# Patient Record
Sex: Female | Born: 2000 | Race: Black or African American | Hispanic: No | Marital: Single | Smoking: Never smoker
Health system: Southern US, Community
[De-identification: ages and names within clinical notes are randomized; demographics above are authoritative.]

## PROBLEM LIST (undated history)

## (undated) DIAGNOSIS — Z85830 Personal history of malignant neoplasm of bone: Secondary | ICD-10-CM

## (undated) DIAGNOSIS — I10 Essential (primary) hypertension: Secondary | ICD-10-CM

## (undated) HISTORY — PX: REPLACEMENT TOTAL KNEE: SUR1224

## (undated) HISTORY — DX: Personal history of malignant neoplasm of bone: Z85.830

---

## 2020-10-03 ENCOUNTER — Emergency Department (HOSPITAL_COMMUNITY)
Admission: EM | Admit: 2020-10-03 | Discharge: 2020-10-03 | Disposition: A | Discharge status: Home / Self Care | Payer: BC Managed Care – PPO | Attending: Student | Admitting: Student

## 2020-10-03 ENCOUNTER — Encounter (HOSPITAL_COMMUNITY): Payer: Self-pay

## 2020-10-03 DIAGNOSIS — Z3A01 Less than 8 weeks gestation of pregnancy: Secondary | ICD-10-CM | POA: Insufficient documentation

## 2020-10-03 DIAGNOSIS — O26851 Spotting complicating pregnancy, first trimester: Secondary | ICD-10-CM | POA: Insufficient documentation

## 2020-10-03 DIAGNOSIS — Z5321 Procedure and treatment not carried out due to patient leaving prior to being seen by health care provider: Secondary | ICD-10-CM | POA: Diagnosis not present

## 2020-10-03 LAB — CBC WITH DIFFERENTIAL/PLATELET
Abs Immature Granulocytes: 0.01 10*3/uL (ref 0.00–0.07)
Basophils Absolute: 0 10*3/uL (ref 0.0–0.1)
Basophils Relative: 0 %
Eosinophils Absolute: 0.2 10*3/uL (ref 0.0–0.5)
Eosinophils Relative: 3 %
HCT: 38.3 % (ref 36.0–46.0)
Hemoglobin: 11.9 g/dL — ABNORMAL LOW (ref 12.0–15.0)
Immature Granulocytes: 0 %
Lymphocytes Relative: 43 %
Lymphs Abs: 2.9 10*3/uL (ref 0.7–4.0)
MCH: 25.9 pg — ABNORMAL LOW (ref 26.0–34.0)
MCHC: 31.1 g/dL (ref 30.0–36.0)
MCV: 83.3 fL (ref 80.0–100.0)
Monocytes Absolute: 0.4 10*3/uL (ref 0.1–1.0)
Monocytes Relative: 6 %
Neutro Abs: 3.3 10*3/uL (ref 1.7–7.7)
Neutrophils Relative %: 48 %
Platelets: 286 10*3/uL (ref 150–400)
RBC: 4.6 MIL/uL (ref 3.87–5.11)
RDW: 14.3 % (ref 11.5–15.5)
WBC: 6.8 10*3/uL (ref 4.0–10.5)
nRBC: 0 % (ref 0.0–0.2)

## 2020-10-03 LAB — URINALYSIS, ROUTINE W REFLEX MICROSCOPIC
Bilirubin Urine: NEGATIVE
Glucose, UA: NEGATIVE mg/dL
Ketones, ur: NEGATIVE mg/dL
Leukocytes,Ua: NEGATIVE
Nitrite: NEGATIVE
Protein, ur: NEGATIVE mg/dL
Specific Gravity, Urine: 1.029 (ref 1.005–1.030)
pH: 5 (ref 5.0–8.0)

## 2020-10-03 LAB — LIPASE, BLOOD: Lipase: 32 U/L (ref 11–51)

## 2020-10-03 LAB — COMPREHENSIVE METABOLIC PANEL
ALT: 12 U/L (ref 0–44)
AST: 18 U/L (ref 15–41)
Albumin: 3.9 g/dL (ref 3.5–5.0)
Alkaline Phosphatase: 81 U/L (ref 38–126)
Anion gap: 9 (ref 5–15)
BUN: 11 mg/dL (ref 6–20)
CO2: 23 mmol/L (ref 22–32)
Calcium: 9.7 mg/dL (ref 8.9–10.3)
Chloride: 106 mmol/L (ref 98–111)
Creatinine, Ser: 0.69 mg/dL (ref 0.44–1.00)
GFR, Estimated: >60 mL/min (ref >60)
Glucose, Bld: 90 mg/dL (ref 70–99)
Potassium: 3.6 mmol/L (ref 3.5–5.1)
Sodium: 138 mmol/L (ref 135–145)
Total Bilirubin: 0.5 mg/dL (ref 0.3–1.2)
Total Protein: 7.7 g/dL (ref 6.5–8.1)

## 2020-10-03 LAB — I-STAT BETA HCG BLOOD, ED (MC, WL, AP ONLY): I-stat hCG, quantitative: 9.3 m[IU]/mL — ABNORMAL HIGH (ref <5)

## 2020-10-03 NOTE — ED Triage Notes (Signed)
Pt took a pregnancy test 1.5 weeks ago that was faintly positive. Pt having light spotting for the past few weeks with cramping.

## 2020-10-03 NOTE — ED Provider Notes (Signed)
Emergency Medicine Provider Triage Evaluation Note  Carisma Troupe , a 20 y.o. female  was evaluated in triage.  Pt complains of abdominal cramping and vaginal bleeding.  Last menstrual cycle in August, took a urine pregnancy test 2 weeks ago that was faintly positive.  She is been having intermittent cramping since then as well as intermittent vaginal bleeding..  Review of Systems  Positive: Vaginal bleeding, abdominal cramping Negative: Fevers, vomiting  Physical Exam  BP (!) 159/108 (BP Location: Right Arm)   Pulse (!) 109   Temp 99.3 F (37.4 C)   Resp 16   LMP 08/31/2020 (Exact Date)   SpO2 100%  Gen:   Awake, no distress   Resp:  Normal effort  MSK:   Moves extremities without difficulty  Other:  Abdomen is soft and nontender  Medical Decision Making  Medically screening exam initiated at 5:07 PM.  Appropriate orders placed.  Avamae Dehaan was informed that the remainder of the evaluation will be completed by another provider, this initial triage assessment does not replace that evaluation, and the importance of remaining in the ED until their evaluation is complete.  Will evaluate to see if patient is pregnant.  If pregnant will send to [facility], if negative will work-up for abdominal pain and vaginal bleeding here in the ED.   Theron Arista, PA-C 10/03/20 1708    Gloris Manchester, MD 10/05/20 1538

## 2021-01-05 ENCOUNTER — Ambulatory Visit (INDEPENDENT_AMBULATORY_CARE_PROVIDER_SITE_OTHER): Payer: Medicaid Other

## 2021-01-05 ENCOUNTER — Other Ambulatory Visit: Payer: Self-pay

## 2021-01-05 VITALS — BP 133/87 | HR 98 | Ht 66.0 in | Wt 179.0 lb

## 2021-01-05 DIAGNOSIS — N912 Amenorrhea, unspecified: Secondary | ICD-10-CM

## 2021-01-05 LAB — POCT URINE PREGNANCY: Preg Test, Ur: POSITIVE — AB

## 2021-01-05 NOTE — Progress Notes (Signed)
Ms. Folz presents today for UPT. She has no unusual complaints. LMP: 10/28/20    OBJECTIVE: Appears well, in no apparent distress.  OB History     Gravida  3   Para  0   Term  0   Preterm  0   AB  2   Living  0      SAB  1   IAB  1   Ectopic  0   Multiple  0   Live Births  0          Home UPT Result: Positive In-Office UPT result: Positive I have reviewed the patient's medical, obstetrical, social, and family histories, and medications.   ASSESSMENT: Positive pregnancy test  PLAN Prenatal care to be completed at: [facility]

## 2021-01-30 ENCOUNTER — Ambulatory Visit (INDEPENDENT_AMBULATORY_CARE_PROVIDER_SITE_OTHER): Payer: BC Managed Care – PPO

## 2021-01-30 ENCOUNTER — Other Ambulatory Visit: Payer: Self-pay

## 2021-01-30 VITALS — BP 122/80 | HR 98 | Ht 66.0 in | Wt 175.4 lb

## 2021-01-30 DIAGNOSIS — O0990 Supervision of high risk pregnancy, unspecified, unspecified trimester: Secondary | ICD-10-CM | POA: Insufficient documentation

## 2021-01-30 DIAGNOSIS — Z3A13 13 weeks gestation of pregnancy: Secondary | ICD-10-CM | POA: Diagnosis not present

## 2021-01-30 DIAGNOSIS — Z3490 Encounter for supervision of normal pregnancy, unspecified, unspecified trimester: Secondary | ICD-10-CM | POA: Insufficient documentation

## 2021-01-30 DIAGNOSIS — O3680X0 Pregnancy with inconclusive fetal viability, not applicable or unspecified: Secondary | ICD-10-CM

## 2021-01-30 DIAGNOSIS — Z3481 Encounter for supervision of other normal pregnancy, first trimester: Secondary | ICD-10-CM | POA: Diagnosis not present

## 2021-01-30 MED ORDER — BLOOD PRESSURE KIT DEVI: 1.0000 | 0 refills | Status: DC

## 2021-01-30 MED ORDER — GOJJI WEIGHT SCALE MISC
1.0000 | 0 refills | Status: DC
Start: 2021-01-30 — End: 2021-05-27

## 2021-01-30 NOTE — Progress Notes (Signed)
New OB Intake  I connected with  Cindy Kelley on 01/30/21 at 10:15 AM EST by in person and verified that I am speaking with the correct person using two identifiers. Nurse is located at [facility] and pt is located at [facility].  I discussed the limitations, risks, security and privacy concerns of performing an evaluation and management service by telephone and the availability of in person appointments. I also discussed with the patient that there may be a patient responsible charge related to this service. The patient expressed understanding and agreed to proceed.  I explained I am completing New OB Intake today. We discussed her EDD of 08/04/21 that is based on LMP of 10/28/20. Pt is G3/P0020. I reviewed her allergies, medications, Medical/Surgical/OB history, and appropriate screenings. I informed her of [facility] services. Based on history, this is a/an  pregnancy uncomplicated .   There are no problems to display for this patient.   Concerns addressed today  Delivery Plans:  Plans to deliver at [facility].   MyChart/Babyscripts MyChart access verified. I explained pt will have some visits in office and some virtually. Babyscripts instructions given and order placed. Patient verifies receipt of registration text/e-mail. Account successfully created and app downloaded.  Blood Pressure Cuff  Blood pressure cuff ordered for patient to pick-up from [employer]. Explained after first prenatal appt pt will check weekly and document in 90.  Weight scale: Patient does not  have weight scale. Weight scale ordered for patient to pick up from [employer].   Anatomy US Explained first scheduled [place] will be around 19 weeks. Dating and viability scan performed today  Labs Discussed Johnsie Cancel genetic screening with patient. Would like both Panorama and Horizon drawn at new OB visit. Routine prenatal labs needed.  Covid Vaccine Patient has not covid vaccine.    Informed patient of  [facility] website  and placed link in her AVS.   Social Determinants of Health Food Insecurity: Patient denies food insecurity. WIC Referral: Patient is interested in referral to [facility].  Transportation: Patient denies transportation needs. Childcare: Discussed no children allowed at ultrasound appointments. Offered childcare services; patient declines childcare services at this time.  Send link to Pregnancy Navigators   Placed OB Box on problem list and updated  First visit review I reviewed new OB appt with pt. I explained she will have a pelvic exam, ob bloodwork with genetic screening, and PAP smear. Explained pt will be seen by Baltazar Najjar at first visit; encounter routed to appropriate provider. Explained that patient will be seen by pregnancy navigator following visit with provider. [facility] information placed in AVS.   Lucianne Lei, RN 01/30/2021  10:31 AM

## 2021-01-30 NOTE — Progress Notes (Signed)
Agree with nurses's documentation of this patient's clinic encounter.  Johnisha Louks L, MD  

## 2021-02-04 ENCOUNTER — Ambulatory Visit (INDEPENDENT_AMBULATORY_CARE_PROVIDER_SITE_OTHER): Payer: BC Managed Care – PPO | Admitting: Obstetrics

## 2021-02-04 ENCOUNTER — Encounter: Payer: Self-pay | Admitting: Obstetrics

## 2021-02-04 ENCOUNTER — Other Ambulatory Visit: Payer: Self-pay

## 2021-02-04 ENCOUNTER — Other Ambulatory Visit (HOSPITAL_COMMUNITY)
Admission: RE | Admit: 2021-02-04 | Discharge: 2021-02-04 | Disposition: A | Discharge status: Home / Self Care | Payer: BC Managed Care – PPO | Source: Ambulatory Visit | Attending: Obstetrics | Admitting: Obstetrics

## 2021-02-04 VITALS — BP 119/78 | HR 98 | Wt 175.0 lb

## 2021-02-04 DIAGNOSIS — Z3A14 14 weeks gestation of pregnancy: Secondary | ICD-10-CM

## 2021-02-04 DIAGNOSIS — Z3480 Encounter for supervision of other normal pregnancy, unspecified trimester: Secondary | ICD-10-CM

## 2021-02-04 DIAGNOSIS — Z3A32 32 weeks gestation of pregnancy: Secondary | ICD-10-CM | POA: Diagnosis not present

## 2021-02-04 DIAGNOSIS — O0990 Supervision of high risk pregnancy, unspecified, unspecified trimester: Secondary | ICD-10-CM | POA: Diagnosis not present

## 2021-02-04 DIAGNOSIS — O119 Pre-existing hypertension with pre-eclampsia, unspecified trimester: Secondary | ICD-10-CM | POA: Diagnosis not present

## 2021-02-04 DIAGNOSIS — D563 Thalassemia minor: Secondary | ICD-10-CM | POA: Diagnosis not present

## 2021-02-04 DIAGNOSIS — O358XX0 Maternal care for other (suspected) fetal abnormality and damage, not applicable or unspecified: Secondary | ICD-10-CM | POA: Diagnosis not present

## 2021-02-04 MED ORDER — PNV-DHA+DOCUSATE 27-1.25-300 MG PO CAPS: 1.0000 | ORAL_CAPSULE | Freq: Every day | ORAL | 4 refills | Status: AC

## 2021-02-04 NOTE — Progress Notes (Signed)
Subjective:    Cindy Kelley is being seen today for her first obstetrical visit.  This is not a planned pregnancy. She is at [redacted]w[redacted]d gestation. Her obstetrical history is significant for  none . Relationship with FOB: significant other, not living together. Patient does intend to breast feed. Pregnancy history fully reviewed.  The information documented in the HPI was reviewed and verified.  Menstrual History: OB History     Gravida  3   Para  0   Term  0   Preterm  0   AB  2   Living  0      SAB  1   IAB  1   Ectopic  0   Multiple  0   Live Births  0            Patient's last menstrual period was 10/28/2020 (exact date).    Past Medical History:  Diagnosis Date   Hx of osteosarcoma    Age 21    Past Surgical History:  Procedure Laterality Date   REPLACEMENT TOTAL KNEE Right    2015, 2017, 2019    (Not in a hospital admission)  Allergies  Allergen Reactions   Peanut-Containing Drug Products Anaphylaxis   Wound Dressing Adhesive Itching and Rash    Social History   Tobacco Use   Smoking status: Never   Smokeless tobacco: Never  Substance Use Topics   Alcohol use: Not Currently    Comment: not since confirmed pregnancy    History reviewed. No pertinent family history.   Review of Systems Constitutional: negative for weight loss Gastrointestinal: negative for vomiting Genitourinary:negative for genital lesions and vaginal discharge and dysuria Musculoskeletal:negative for back pain Behavioral/Psych: negative for abusive relationship, depression, illegal drug usage and tobacco use    Objective:    BP 119/78    Pulse 98    Wt 175 lb (79.4 kg)    LMP 10/28/2020 (Exact Date)    BMI 28.25 kg/m  General Appearance:    Alert, cooperative, no distress, appears stated age  Head:    Normocephalic, without obvious abnormality, atraumatic  Eyes:    PERRL, conjunctiva/corneas clear, EOM's intact, fundi    benign, both eyes  Ears:    Normal  TM's and external ear canals, both ears  Nose:   Nares normal, septum midline, mucosa normal, no drainage    or sinus tenderness  Throat:   Lips, mucosa, and tongue normal; teeth and gums normal  Neck:   Supple, symmetrical, trachea midline, no adenopathy;    thyroid:  no enlargement/tenderness/nodules; no carotid   bruit or JVD  Back:     Symmetric, no curvature, ROM normal, no CVA tenderness  Lungs:     Clear to auscultation bilaterally, respirations unlabored  Chest Wall:    No tenderness or deformity   Heart:    Regular rate and rhythm, S1 and S2 normal, no murmur, rub   or gallop  Breast Exam:    No tenderness, masses, or nipple abnormality  Abdomen:     Soft, non-tender, bowel sounds active all four quadrants,    no masses, no organomegaly  Genitalia:    Normal female without lesion, discharge or tenderness  Extremities:   Extremities normal, atraumatic, no cyanosis or edema  Pulses:   2+ and symmetric all extremities  Skin:   Skin color, texture, turgor normal, no rashes or lesions  Lymph nodes:   Cervical, supraclavicular, and axillary nodes normal  Neurologic:   CNII-XII intact,  normal strength, sensation and reflexes    throughout      Lab Review Urine pregnancy test Labs reviewed yes Radiologic studies reviewed yes  Assessment:    Pregnancy at [redacted]w[redacted]d weeks    Plan:    1. Supervision of other normal pregnancy, antepartum - Cytology - PAP( [place]) - Cervicovaginal ancillary only( [place]) - Obstetric Panel, Including HIV - Hepatitis C antibody - Culture, OB Urine - Genetic Screening - Prenat-FeFum-DSS-FA-DHA w/o A (PNV-DHA+DOCUSATE) 27-1.25-300 MG CAPS; Take 1 capsule by mouth daily before breakfast.  Dispense: 90 capsule; Refill: 4   Prenatal vitamins.  Counseling provided regarding continued use of seat belts, cessation of alcohol consumption, smoking or use of illicit drugs; infection precautions i.e., influenza/TDAP immunizations, toxoplasmosis,CMV,  parvovirus, listeria and varicella; workplace safety, exercise during pregnancy; routine dental care, safe medications, sexual activity, hot tubs, saunas, pools, travel, caffeine use, fish and methlymercury, potential toxins, hair treatments, varicose veins Weight gain recommendations per IOM guidelines reviewed: underweight/BMI< 18.5--> gain 28 - 40 lbs; normal weight/BMI 18.5 - 24.9--> gain 25 - 35 lbs; overweight/BMI 25 - 29.9--> gain 15 - 25 lbs; obese/BMI >30->gain  11 - 20 lbs Problem list reviewed and updated. FIRST/CF mutation testing/NIPT/QUAD SCREEN/fragile X/Ashkenazi Jewish population testing/Spinal muscular atrophy discussed: requested. Role of ultrasound in pregnancy discussed; fetal survey: requested. Amniocentesis discussed: not indicated.  No orders of the defined types were placed in this encounter.  Orders Placed This Encounter  Procedures   Culture, OB Urine   Obstetric Panel, Including HIV   Hepatitis C antibody   Genetic Screening    Follow up in 4 weeks.  I have spent a total of 20 minutes of face-to-face time, excluding clinical staff time, reviewing notes and preparing to see patient, ordering tests and/or medications, and counseling the patient.   Brock Bad, MD 02/04/2021 2:15 PM

## 2021-02-05 LAB — OBSTETRIC PANEL, INCLUDING HIV
Antibody Screen: NEGATIVE
Basophils Absolute: 0 10*3/uL (ref 0.0–0.2)
Basos: 1 %
EOS (ABSOLUTE): 0.1 10*3/uL (ref 0.0–0.4)
Eos: 1 %
HIV Screen 4th Generation wRfx: NONREACTIVE
Hematocrit: 38.4 % (ref 34.0–46.6)
Hemoglobin: 12.7 g/dL (ref 11.1–15.9)
Hepatitis B Surface Ag: NEGATIVE
Immature Grans (Abs): 0.1 10*3/uL (ref 0.0–0.1)
Immature Granulocytes: 1 %
Lymphocytes Absolute: 2 10*3/uL (ref 0.7–3.1)
Lymphs: 29 %
MCH: 26.7 pg (ref 26.6–33.0)
MCHC: 33.1 g/dL (ref 31.5–35.7)
MCV: 81 fL (ref 79–97)
Monocytes Absolute: 0.4 10*3/uL (ref 0.1–0.9)
Monocytes: 5 %
Neutrophils Absolute: 4.4 10*3/uL (ref 1.4–7.0)
Neutrophils: 63 %
Platelets: 263 10*3/uL (ref 150–450)
RBC: 4.76 x10E6/uL (ref 3.77–5.28)
RDW: 14.2 % (ref 11.7–15.4)
RPR Ser Ql: NONREACTIVE
Rh Factor: POSITIVE
Rubella Antibodies, IGG: 5.43 index (ref >0.99)
WBC: 7 10*3/uL (ref 3.4–10.8)

## 2021-02-05 LAB — CERVICOVAGINAL ANCILLARY ONLY
Bacterial Vaginitis (gardnerella): NEGATIVE
Candida Glabrata: NEGATIVE
Candida Vaginitis: POSITIVE — AB
Chlamydia: NEGATIVE
Comment: NEGATIVE
Comment: NEGATIVE
Comment: NEGATIVE
Comment: NEGATIVE
Comment: NEGATIVE
Comment: NORMAL
Neisseria Gonorrhea: NEGATIVE
Trichomonas: NEGATIVE

## 2021-02-05 LAB — HEPATITIS C ANTIBODY: Hep C Virus Ab: <0.1 s/co ratio (ref 0.0–0.9)

## 2021-02-08 LAB — URINE CULTURE, OB REFLEX

## 2021-02-08 LAB — CULTURE, OB URINE

## 2021-02-09 ENCOUNTER — Other Ambulatory Visit: Payer: Self-pay | Admitting: Obstetrics

## 2021-02-09 DIAGNOSIS — N3000 Acute cystitis without hematuria: Secondary | ICD-10-CM

## 2021-02-09 LAB — CYTOLOGY - PAP
Diagnosis: NEGATIVE
Diagnosis: REACTIVE

## 2021-02-09 MED ORDER — AMOXICILLIN-POT CLAVULANATE 875-125 MG PO TABS: 1.0000 | ORAL_TABLET | Freq: Two times a day (BID) | ORAL | 0 refills | Status: DC

## 2021-02-10 ENCOUNTER — Encounter: Payer: Self-pay | Admitting: *Deleted

## 2021-02-10 ENCOUNTER — Telehealth: Payer: Self-pay | Admitting: *Deleted

## 2021-02-10 NOTE — Telephone Encounter (Signed)
TC to patient to inform of UTI on OB Urine Culture and RX Augmentin sent by Dr. Clearance Coots. Instructed to complete as directed to avoid UTI complications in pregnancy. MyChart message with UTI education sent.

## 2021-02-10 NOTE — Progress Notes (Signed)
Patient informed. RX sent by Dr. Clearance Coots. MyChart message with education on UTI in pregnancy sent.

## 2021-02-16 ENCOUNTER — Encounter: Payer: Self-pay | Admitting: Obstetrics

## 2021-02-17 ENCOUNTER — Other Ambulatory Visit: Payer: Self-pay | Admitting: Obstetrics

## 2021-02-17 DIAGNOSIS — B379 Candidiasis, unspecified: Secondary | ICD-10-CM

## 2021-02-17 MED ORDER — TERCONAZOLE 0.4 % VA CREA: 1.0000 | TOPICAL_CREAM | Freq: Every day | VAGINAL | 0 refills | Status: DC

## 2021-02-18 ENCOUNTER — Telehealth: Payer: Self-pay

## 2021-02-18 ENCOUNTER — Encounter: Payer: Self-pay | Admitting: Obstetrics

## 2021-02-18 DIAGNOSIS — D563 Thalassemia minor: Secondary | ICD-10-CM

## 2021-02-18 NOTE — Telephone Encounter (Signed)
Called patient to inform her of abnormal Horizon screening for alpha thalassemia. Left message on vm for patient to return call to the office.

## 2021-02-24 ENCOUNTER — Other Ambulatory Visit: Payer: Self-pay | Admitting: *Deleted

## 2021-02-24 DIAGNOSIS — Z3480 Encounter for supervision of other normal pregnancy, unspecified trimester: Secondary | ICD-10-CM

## 2021-03-04 ENCOUNTER — Encounter: Payer: Self-pay | Admitting: Obstetrics and Gynecology

## 2021-03-04 ENCOUNTER — Other Ambulatory Visit: Payer: Self-pay

## 2021-03-04 ENCOUNTER — Ambulatory Visit (INDEPENDENT_AMBULATORY_CARE_PROVIDER_SITE_OTHER): Payer: BC Managed Care – PPO | Admitting: Obstetrics and Gynecology

## 2021-03-04 DIAGNOSIS — Z3481 Encounter for supervision of other normal pregnancy, first trimester: Secondary | ICD-10-CM

## 2021-03-04 NOTE — Progress Notes (Signed)
Subjective:  Cindy Kelley is a 21 y.o. G3P0020 at [redacted]w[redacted]d being seen today for ongoing prenatal care.  She is currently monitored for the following issues for this low-risk pregnancy and has Supervision of normal pregnancy on their problem list.  Patient reports no complaints.  Contractions: Not present. Vag. Bleeding: None.  Movement: Present. Denies leaking of fluid.   The following portions of the patient's history were reviewed and updated as appropriate: allergies, current medications, past family history, past medical history, past social history, past surgical history and problem list. Problem list updated.  Objective:   Vitals:   03/04/21 1343  BP: 117/81  Pulse: (!) 112  Weight: 175 lb 8 oz (79.6 kg)    Fetal Status: Fetal Heart Rate (bpm): 160   Movement: Present     General:  Alert, oriented and cooperative. Patient is in no acute distress.  Skin: Skin is warm and dry. No rash noted.   Cardiovascular: Normal heart rate noted  Respiratory: Normal respiratory effort, no problems with respiration noted  Abdomen: Soft, gravid, appropriate for gestational age. Pain/Pressure: Absent     Pelvic:  Cervical exam deferred        Extremities: Normal range of motion.     Mental Status: Normal mood and affect. Normal behavior. Normal judgment and thought content.   Urinalysis:      Assessment and Plan:  Pregnancy: G3P0020 at [redacted]w[redacted]d  1. Encounter for supervision of other normal pregnancy in first trimester Stable AFP today Anatomy scan scheduled  Preterm labor symptoms and general obstetric precautions including but not limited to vaginal bleeding, contractions, leaking of fluid and fetal movement were reviewed in detail with the patient. Please refer to After Visit Summary for other counseling recommendations.  Return in about 4 weeks (around 04/01/2021) for OB visit, face to face, any provider.   Chancy Milroy, MD

## 2021-03-04 NOTE — Patient Instructions (Signed)

## 2021-03-04 NOTE — Addendum Note (Signed)
Addended by: Marya Landry D on: 03/04/2021 02:13 PM   Modules accepted: Orders

## 2021-03-10 ENCOUNTER — Ambulatory Visit: Payer: BC Managed Care – PPO | Attending: Obstetrics

## 2021-03-10 ENCOUNTER — Other Ambulatory Visit: Payer: Self-pay

## 2021-03-10 ENCOUNTER — Ambulatory Visit: Payer: BC Managed Care – PPO | Admitting: *Deleted

## 2021-03-10 ENCOUNTER — Ambulatory Visit: Payer: Self-pay | Admitting: Genetics

## 2021-03-10 ENCOUNTER — Encounter: Payer: Self-pay | Admitting: *Deleted

## 2021-03-10 ENCOUNTER — Ambulatory Visit (HOSPITAL_BASED_OUTPATIENT_CLINIC_OR_DEPARTMENT_OTHER): Payer: BC Managed Care – PPO

## 2021-03-10 VITALS — BP 127/77 | HR 73

## 2021-03-10 DIAGNOSIS — Z148 Genetic carrier of other disease: Secondary | ICD-10-CM | POA: Diagnosis not present

## 2021-03-10 DIAGNOSIS — Z3481 Encounter for supervision of other normal pregnancy, first trimester: Secondary | ICD-10-CM | POA: Insufficient documentation

## 2021-03-10 DIAGNOSIS — Z8279 Family history of other congenital malformations, deformations and chromosomal abnormalities: Secondary | ICD-10-CM | POA: Insufficient documentation

## 2021-03-10 DIAGNOSIS — O358XX0 Maternal care for other (suspected) fetal abnormality and damage, not applicable or unspecified: Secondary | ICD-10-CM | POA: Insufficient documentation

## 2021-03-10 DIAGNOSIS — Z3A19 19 weeks gestation of pregnancy: Secondary | ICD-10-CM | POA: Insufficient documentation

## 2021-03-10 DIAGNOSIS — Z3480 Encounter for supervision of other normal pregnancy, unspecified trimester: Secondary | ICD-10-CM

## 2021-03-10 DIAGNOSIS — Z363 Encounter for antenatal screening for malformations: Secondary | ICD-10-CM | POA: Insufficient documentation

## 2021-03-10 DIAGNOSIS — D563 Thalassemia minor: Secondary | ICD-10-CM

## 2021-03-10 NOTE — Progress Notes (Signed)
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   EDC: 08/04/2021 LMP: 10/28/2020 Referring Provider:  Brock Bad, MD  EGA: [redacted]w[redacted]d Genetic Counselor: Cindy Dunk, MS, CGC  OB Hx: [id number] Date of Appointment: 03/10/2021  Accompanied by: Her reproductive Kelley (Cindy Kelley) Face to Face Time: 30 Minutes   Previous Testing Completed: [place] previously completed Non-Invasive Prenatal Screening (NIPS) in this pregnancy. The result is low risk. This screening significantly reduces the risk that the current pregnancy has Down syndrome, Trisomy 4, Trisomy 74, and common sex chromosome conditions, however, the risk is not zero given the limitations of NIPS. Additionally, there are many genetic conditions that cannot be detected by NIPS.  Cindy Kelley previously completed carrier screening. She screened to be a silent carrier for alpha thalassemia (aa/a-). She screened to not be a carrier for Cystic Fibrosis (CF), Spinal Muscular Atrophy (SMA), and beta hemoglobinopathies. A negative result on carrier screening reduces the likelihood of being a carrier, however, does not entirely rule out the possibility.   Medical History:  This is Cindy Kelley's 3rd pregnancy. She has had two early losses, one of which was elective. Reports she takes prenatal vitamins. Denies personal history of diabetes, high blood pressure, thyroid conditions, and seizures. Denies bleeding, infections, and fevers in this pregnancy. Denies using tobacco, alcohol, or street drugs in this pregnancy.   Family History: A pedigree was created and scanned into Epic under the Media tab. Cindy reports his first cousin once removed was disabled. He reports this cousin died in childhood. No medical records are available for this individual.  Maternal ethnicity reported as Leisure centre manager and paternal ethnicity reported as Leisure centre manager. Denies  Ashkenazi Jewish ancestry. Family history not remarkable for consanguinity, individuals with birth defects, multiple spontaneous abortions, still births, or unexplained neonatal death.     Genetic Counseling:   Silent Carrier for Alpha Thalassemia. Cindy Kelley is a silent carrier for alpha thalassemia (??/?-). Positive for the pathogenic alpha 3.7 deletion of the HBA2 gene. With Cindy Kelley's alpha thalassemia screening result, we know that she has three working copies of the alpha-globin genes while the 4th alpha-globin gene is deleted. Each of Cindy Kelley will either inherit two functional copies or one functional copy with one deletion from her. Cindy Kelley is not at an increased risk to have a baby with fetal hydrops due to Hemoglobin Barts disease (--/--) regardless of her reproductive Kelley's carrier status. We discussed there would be a 25% risk for the current pregnancy to be affected with Hemoglobin H disease (--/?-) if her reproductive Kelley is found to be an alpha thalassemia carrier in the cis configuration (??/--). Clinical features of Hemoglobin H disease are highly variable and generally develop in the first years of life. The primary symptoms include moderate anemia with marked microcytosis, jaundice, and hepatosplenomegaly. Some affected individuals do not require blood transfusions while others may require occasional blood transfusions throughout their lifetime. Because Cindy Kelley is a silent carrier for alpha thalassemia (??/?-), carrier screening for her reproductive Kelley is recommended to determine risk for the current pregnancy. We reviewed with Cindy Kelley that if her Kelley is found to be a carrier for alpha thalassemia, given his Cindy Kelley ancestry, it is more likely for him to be a silent carrier (??/?-) or a carrier in the trans configuration (?-/?-) as the cis configuration (??/--) has been reported very rarely in individuals with African Kelley ancestry. If her reproductive  Kelley is a non-carrier (??/??), a silent carrier (??/?-),  or a carrier in the trans configuration (?-/?-) there would not be an increased risk for the pregnancy to have Hemoglobin H disease.     Testing/Screening Options:   Carrier Screening. Per the ACOG Committee Opinion 691, if an individual is found to be a carrier for a specific condition, the individual's reproductive Kelley should be offered testing in order to receive informed genetic counseling about potential reproductive outcomes. Genetic counseling offered carrier screening for Cindy Kelley's reproductive Kelley for alpha thalassemia given Cindy Kelley's carrier status.     Patient Plan:  Proceed with: Routine prenatal care Informed consent was obtained. All questions were answered.  Declined: Carrier Screening for alpha thalassemia for Cindy Kelley's reproductive Kelley   Thank you for sharing in the care of Cindy Kelley with [place].  Please do not hesitate to contact us if you have any questions.  Cindy Dunk, MS, [facility]

## 2021-03-11 LAB — AFP, SERUM, OPEN SPINA BIFIDA
AFP MoM: 2.1
AFP Value: 96.6 ng/mL
Gest. Age on Collection Date: 18.1 weeks
Maternal Age At EDD: 21.7 yr
OSBR Risk 1 IN: 1294
Test Results:: NEGATIVE
Weight: 175 [lb_av]

## 2021-04-01 ENCOUNTER — Other Ambulatory Visit: Payer: Self-pay

## 2021-04-01 ENCOUNTER — Ambulatory Visit (INDEPENDENT_AMBULATORY_CARE_PROVIDER_SITE_OTHER): Payer: BC Managed Care – PPO | Admitting: Obstetrics

## 2021-04-01 ENCOUNTER — Encounter: Payer: Self-pay | Admitting: Obstetrics

## 2021-04-01 VITALS — BP 131/87 | HR 68 | Wt 180.0 lb

## 2021-04-01 DIAGNOSIS — Z3480 Encounter for supervision of other normal pregnancy, unspecified trimester: Secondary | ICD-10-CM

## 2021-04-01 NOTE — Progress Notes (Signed)
ROB 22.1 wks ?Reports white spots on vagina, looks like a place where there was a scab but it fell off per SO. ?Elevated BP X 2, no signs/symptoms of pre E ?

## 2021-04-01 NOTE — Progress Notes (Signed)
Subjective:  ?Cindy Kelley is a 21 y.o. G3P0020 at [redacted]w[redacted]d being seen today for ongoing prenatal care.  She is currently monitored for the following issues for this low-risk pregnancy and has Supervision of normal pregnancy and Alpha thalassemia silent carrier on their problem list. ? ?Patient reports no complaints.  Contractions: Not present. Vag. Bleeding: None.  Movement: Present. Denies leaking of fluid.  ? ?The following portions of the patient's history were reviewed and updated as appropriate: allergies, current medications, past family history, past medical history, past social history, past surgical history and problem list. Problem list updated. ? ?Objective:  ? ?Vitals:  ? 04/01/21 1408 04/01/21 1415  ?BP: 139/87 131/87  ?Pulse:  68  ?Weight: 180 lb (81.6 kg)   ? ? ?Fetal Status: Fetal Heart Rate (bpm): 150   Movement: Present    ? ?General:  Alert, oriented and cooperative. Patient is in no acute distress.  ?Skin: Skin is warm and dry. No rash noted.   ?Cardiovascular: Normal heart rate noted  ?Respiratory: Normal respiratory effort, no problems with respiration noted  ?Abdomen: Soft, gravid, appropriate for gestational age. Pain/Pressure: Absent     ?Pelvic:  Cervical exam deferred        ?Extremities: Normal range of motion.     ?Mental Status: Normal mood and affect. Normal behavior. Normal judgment and thought content.  ? ?Urinalysis:     ? ?Assessment and Plan:  ?Pregnancy: G3P0020 at [redacted]w[redacted]d ? ?1. Supervision of other normal pregnancy, antepartum ? ? ?Preterm labor symptoms and general obstetric precautions including but not limited to vaginal bleeding, contractions, leaking of fluid and fetal movement were reviewed in detail with the patient. ?Please refer to After Visit Summary for other counseling recommendations.  ? ?Return in about 2 weeks (around 04/15/2021) for ROB. ? ? ?Brock Bad, MD  ?04/01/21  ?

## 2021-04-02 ENCOUNTER — Telehealth: Payer: Self-pay | Admitting: *Deleted

## 2021-04-02 NOTE — Telephone Encounter (Signed)
Received call from [facility]  regarding pt BP today- 129/95. ? ?Attempt to call pt and discuss. ?No answer, VM full.  ?

## 2021-04-09 ENCOUNTER — Telehealth: Payer: Self-pay | Admitting: Emergency Medicine

## 2021-04-09 NOTE — Telephone Encounter (Signed)
Breast pump request order signed and faxed with confirmation.  ?

## 2021-04-15 ENCOUNTER — Ambulatory Visit (INDEPENDENT_AMBULATORY_CARE_PROVIDER_SITE_OTHER): Payer: BC Managed Care – PPO | Admitting: Obstetrics and Gynecology

## 2021-04-15 VITALS — BP 124/86 | HR 98 | Wt 177.0 lb

## 2021-04-15 DIAGNOSIS — Z3A24 24 weeks gestation of pregnancy: Secondary | ICD-10-CM

## 2021-04-15 DIAGNOSIS — D563 Thalassemia minor: Secondary | ICD-10-CM

## 2021-04-15 DIAGNOSIS — Z3482 Encounter for supervision of other normal pregnancy, second trimester: Secondary | ICD-10-CM

## 2021-04-15 NOTE — Progress Notes (Signed)
? ?  PRENATAL VISIT NOTE ? ?Subjective:  ?Cindy Kelley is a 21 y.o. G3P0020 at [redacted]w[redacted]d being seen today for ongoing prenatal care.  She is currently monitored for the following issues for this low-risk pregnancy and has Supervision of normal pregnancy and Alpha thalassemia silent carrier on their problem list. ? ?Patient doing well with no acute concerns today. She reports no complaints.  Contractions: Not present. Vag. Bleeding: None.  Movement: Present. Denies leaking of fluid.  ? ?The following portions of the patient's history were reviewed and updated as appropriate: allergies, current medications, past family history, past medical history, past social history, past surgical history and problem list. Problem list updated. ? ?Objective:  ? ?Vitals:  ? 04/15/21 1350  ?BP: 124/86  ?Pulse: 98  ?Weight: 177 lb (80.3 kg)  ? ? ?Fetal Status: Fetal Heart Rate (bpm): 153 Fundal Height: 24 cm Movement: Present    ? ?General:  Alert, oriented and cooperative. Patient is in no acute distress.  ?Skin: Skin is warm and dry. No rash noted.   ?Cardiovascular: Normal heart rate noted  ?Respiratory: Normal respiratory effort, no problems with respiration noted  ?Abdomen: Soft, gravid, appropriate for gestational age.  Pain/Pressure: Absent     ?Pelvic: Cervical exam deferred        ?Extremities: Normal range of motion.  Edema: None  ?Mental Status:  Normal mood and affect. Normal behavior. Normal judgment and thought content.  ? ?Assessment and Plan:  ?Pregnancy: G3P0020 at [redacted]w[redacted]d ? ?1. [redacted] weeks gestation of pregnancy ? ? ?2. Encounter for supervision of other normal pregnancy in second trimester ?Continue routine care, 2 hour GTT next visit, advised increased hydration ? ?3. Alpha thalassemia silent carrier ? ? ?Preterm labor symptoms and general obstetric precautions including but not limited to vaginal bleeding, contractions, leaking of fluid and fetal movement were reviewed in detail with the patient. ? ?Please refer to  After Visit Summary for other counseling recommendations.  ? ?Return in about 4 weeks (around 05/13/2021) for ROB, in person, 2 hr GTT, 3rd trim labs. ? ? ?Mariel Aloe, MD ?Faculty Attending ?[facility] ?  ?

## 2021-05-13 ENCOUNTER — Ambulatory Visit (INDEPENDENT_AMBULATORY_CARE_PROVIDER_SITE_OTHER): Payer: BC Managed Care – PPO | Admitting: Obstetrics and Gynecology

## 2021-05-13 ENCOUNTER — Encounter: Payer: Self-pay | Admitting: Obstetrics and Gynecology

## 2021-05-13 ENCOUNTER — Other Ambulatory Visit: Payer: BC Managed Care – PPO

## 2021-05-13 VITALS — BP 141/100 | HR 68 | Wt 184.0 lb

## 2021-05-13 DIAGNOSIS — D563 Thalassemia minor: Secondary | ICD-10-CM

## 2021-05-13 DIAGNOSIS — Z3482 Encounter for supervision of other normal pregnancy, second trimester: Secondary | ICD-10-CM

## 2021-05-13 NOTE — Progress Notes (Signed)
Subjective:  ?Cindy Kelley is a 21 y.o. G3P0020 at [redacted]w[redacted]d being seen today for ongoing prenatal care.  She is currently monitored for the following issues for this low-risk pregnancy and has Supervision of normal pregnancy and Alpha thalassemia silent carrier on their problem list. ? ?Patient reports General discomforts of pregnancy. Denies HA or visual changes. BP at home normal. Nerverous about Glucola test.  Contractions: Irritability. Vag. Bleeding: None.  Movement: Present. Denies leaking of fluid.  ? ?The following portions of the patient's history were reviewed and updated as appropriate: allergies, current medications, past family history, past medical history, past social history, past surgical history and problem list. Problem list updated. ? ?Objective:  ? ?Vitals:  ? 05/13/21 0833 05/13/21 0845  ?BP: (!) 143/104 (!) 141/100  ?Pulse: 68   ?Weight: 184 lb (83.5 kg)   ? ? ?Fetal Status: Fetal Heart Rate (bpm): 142   Movement: Present    ? ?General:  Alert, oriented and cooperative. Patient is in no acute distress.  ?Skin: Skin is warm and dry. No rash noted.   ?Cardiovascular: Normal heart rate noted  ?Respiratory: Normal respiratory effort, no problems with respiration noted  ?Abdomen: Soft, gravid, appropriate for gestational age. Pain/Pressure: Absent     ?Pelvic:  Cervical exam deferred        ?Extremities: Normal range of motion.  Edema: None  ?Mental Status: Normal mood and affect. Normal behavior. Normal judgment and thought content.  ? ?Urinalysis:     ? ?Assessment and Plan:  ?Pregnancy: G3P0020 at [redacted]w[redacted]d ? ?1. Encounter for supervision of other normal pregnancy in second trimester ?Stable ?Glucola and 28 week labs today ?Tdap vaccine ?Continue to monitor BP at home ?Nurse visit in 1 week for BP check ? ?2. Alpha thalassemia silent carrier ?Stable ? ?Preterm labor symptoms and general obstetric precautions including but not limited to vaginal bleeding, contractions, leaking of fluid and  fetal movement were reviewed in detail with the patient. ?Please refer to After Visit Summary for other counseling recommendations.  ?Return in about 2 weeks (around 05/27/2021) for OB visit, face to face, any provider. ? ? ?Hermina Staggers, MD ?

## 2021-05-13 NOTE — Progress Notes (Signed)
ROB 28.[redacted] wks GA ?GTT, CBC, HIV, RPR today ?TDAP offered and considering/ not sure ?Depression and anxiety screen positive, referral to [facility] ? ?BP 143 104, no HA, visual changes, RUQ abd pain. ? ?

## 2021-05-13 NOTE — Patient Instructions (Signed)

## 2021-05-13 NOTE — Addendum Note (Signed)
Addended by: Harrel Lemon on: 05/13/2021 09:49 AM ? ? Modules accepted: Orders ? ?

## 2021-05-14 LAB — CBC
Hematocrit: 39 % (ref 34.0–46.6)
Hemoglobin: 12.8 g/dL (ref 11.1–15.9)
MCH: 27.3 pg (ref 26.6–33.0)
MCHC: 32.8 g/dL (ref 31.5–35.7)
MCV: 83 fL (ref 79–97)
Platelets: 247 10*3/uL (ref 150–450)
RBC: 4.69 x10E6/uL (ref 3.77–5.28)
RDW: 15 % (ref 11.7–15.4)
WBC: 8.1 10*3/uL (ref 3.4–10.8)

## 2021-05-14 LAB — GLUCOSE TOLERANCE, 2 HOURS W/ 1HR
Glucose, 1 hour: 85 mg/dL (ref 70–179)
Glucose, 2 hour: 84 mg/dL (ref 70–152)
Glucose, Fasting: 69 mg/dL — ABNORMAL LOW (ref 70–91)

## 2021-05-14 LAB — RPR: RPR Ser Ql: NONREACTIVE

## 2021-05-14 LAB — HIV ANTIBODY (ROUTINE TESTING W REFLEX): HIV Screen 4th Generation wRfx: NONREACTIVE

## 2021-05-20 ENCOUNTER — Ambulatory Visit (INDEPENDENT_AMBULATORY_CARE_PROVIDER_SITE_OTHER): Payer: BC Managed Care – PPO | Admitting: Emergency Medicine

## 2021-05-20 VITALS — BP 147/97 | HR 80 | Wt 184.7 lb

## 2021-05-20 DIAGNOSIS — Z3482 Encounter for supervision of other normal pregnancy, second trimester: Secondary | ICD-10-CM

## 2021-05-20 NOTE — Progress Notes (Signed)
Patient presents for BP check. Denies any headache, vision changes.  ?BP 147/97.  ?Per Rip Harbour, MD, CBC, CMP, UPC to be collected today. ?Pt should monitor BP at home. Return precautions given. Pt verbalized understanding. ?

## 2021-05-21 LAB — COMPREHENSIVE METABOLIC PANEL
ALT: 9 IU/L (ref 0–32)
AST: 18 IU/L (ref 0–40)
Albumin/Globulin Ratio: 1.1 — ABNORMAL LOW (ref 1.2–2.2)
Albumin: 3.5 g/dL — ABNORMAL LOW (ref 3.9–5.0)
Alkaline Phosphatase: 129 IU/L — ABNORMAL HIGH (ref 44–121)
BUN/Creatinine Ratio: 16 (ref 9–23)
BUN: 13 mg/dL (ref 6–20)
Bilirubin Total: 0.3 mg/dL (ref 0.0–1.2)
CO2: 19 mmol/L — ABNORMAL LOW (ref 20–29)
Calcium: 9.5 mg/dL (ref 8.7–10.2)
Chloride: 103 mmol/L (ref 96–106)
Creatinine, Ser: 0.82 mg/dL (ref 0.57–1.00)
Globulin, Total: 3.2 g/dL (ref 1.5–4.5)
Glucose: 76 mg/dL (ref 70–99)
Potassium: 4.3 mmol/L (ref 3.5–5.2)
Sodium: 136 mmol/L (ref 134–144)
Total Protein: 6.7 g/dL (ref 6.0–8.5)
eGFR: 104 mL/min/{1.73_m2} (ref >59)

## 2021-05-21 LAB — PROTEIN / CREATININE RATIO, URINE
Creatinine, Urine: 168.6 mg/dL
Protein, Ur: 71.7 mg/dL
Protein/Creat Ratio: 425 mg/g creat — ABNORMAL HIGH (ref 0–200)

## 2021-05-21 LAB — CBC
Hematocrit: 40.4 % (ref 34.0–46.6)
Hemoglobin: 13.2 g/dL (ref 11.1–15.9)
MCH: 27.4 pg (ref 26.6–33.0)
MCHC: 32.7 g/dL (ref 31.5–35.7)
MCV: 84 fL (ref 79–97)
Platelets: 254 10*3/uL (ref 150–450)
RBC: 4.81 x10E6/uL (ref 3.77–5.28)
RDW: 15.3 % (ref 11.7–15.4)
WBC: 8.6 10*3/uL (ref 3.4–10.8)

## 2021-05-22 ENCOUNTER — Encounter: Payer: Self-pay | Admitting: Obstetrics and Gynecology

## 2021-05-22 DIAGNOSIS — O119 Pre-existing hypertension with pre-eclampsia, unspecified trimester: Secondary | ICD-10-CM | POA: Insufficient documentation

## 2021-05-22 DIAGNOSIS — O1400 Mild to moderate pre-eclampsia, unspecified trimester: Secondary | ICD-10-CM | POA: Insufficient documentation

## 2021-05-22 HISTORY — DX: Pre-existing hypertension with pre-eclampsia, unspecified trimester: O11.9

## 2021-05-25 ENCOUNTER — Telehealth: Payer: Self-pay | Admitting: Obstetrics & Gynecology

## 2021-05-25 NOTE — Telephone Encounter (Signed)
Call from [employer]- BP 140/101.  Left message to recheck BP and call back if remains elevated. Also given preeclampsia precautions ? ?Myna Hidalgo, DO ?Attending Obstetrician & Gynecologist, Faculty Practice ?Center for [employer], [facility] ? ? ?

## 2021-05-27 ENCOUNTER — Inpatient Hospital Stay (HOSPITAL_COMMUNITY)
Admission: AD | Admit: 2021-05-27 | Discharge: 2021-05-27 | Disposition: A | Discharge status: Home / Self Care | Payer: BC Managed Care – PPO | Attending: Obstetrics and Gynecology | Admitting: Obstetrics and Gynecology

## 2021-05-27 ENCOUNTER — Other Ambulatory Visit: Payer: Self-pay

## 2021-05-27 ENCOUNTER — Encounter (HOSPITAL_COMMUNITY): Payer: Self-pay | Admitting: Obstetrics and Gynecology

## 2021-05-27 ENCOUNTER — Ambulatory Visit (INDEPENDENT_AMBULATORY_CARE_PROVIDER_SITE_OTHER): Payer: Medicaid Other | Admitting: Licensed Clinical Social Worker

## 2021-05-27 ENCOUNTER — Ambulatory Visit (INDEPENDENT_AMBULATORY_CARE_PROVIDER_SITE_OTHER): Payer: BC Managed Care – PPO | Admitting: Obstetrics & Gynecology

## 2021-05-27 VITALS — BP 151/112 | HR 72 | Wt 187.0 lb

## 2021-05-27 DIAGNOSIS — F4322 Adjustment disorder with anxiety: Secondary | ICD-10-CM

## 2021-05-27 DIAGNOSIS — Z3A30 30 weeks gestation of pregnancy: Secondary | ICD-10-CM | POA: Diagnosis not present

## 2021-05-27 DIAGNOSIS — Z3483 Encounter for supervision of other normal pregnancy, third trimester: Secondary | ICD-10-CM

## 2021-05-27 DIAGNOSIS — O26893 Other specified pregnancy related conditions, third trimester: Secondary | ICD-10-CM | POA: Insufficient documentation

## 2021-05-27 DIAGNOSIS — O119 Pre-existing hypertension with pre-eclampsia, unspecified trimester: Secondary | ICD-10-CM | POA: Diagnosis not present

## 2021-05-27 DIAGNOSIS — O10913 Unspecified pre-existing hypertension complicating pregnancy, third trimester: Secondary | ICD-10-CM | POA: Diagnosis present

## 2021-05-27 DIAGNOSIS — Z3689 Encounter for other specified antenatal screening: Secondary | ICD-10-CM

## 2021-05-27 DIAGNOSIS — O1403 Mild to moderate pre-eclampsia, third trimester: Secondary | ICD-10-CM

## 2021-05-27 HISTORY — DX: Essential (primary) hypertension: I10

## 2021-05-27 LAB — CBC
HCT: 36.5 % (ref 36.0–46.0)
Hemoglobin: 12.2 g/dL (ref 12.0–15.0)
MCH: 28 pg (ref 26.0–34.0)
MCHC: 33.4 g/dL (ref 30.0–36.0)
MCV: 83.7 fL (ref 80.0–100.0)
Platelets: 241 10*3/uL (ref 150–400)
RBC: 4.36 MIL/uL (ref 3.87–5.11)
RDW: 15.1 % (ref 11.5–15.5)
WBC: 8.4 10*3/uL (ref 4.0–10.5)
nRBC: 0 % (ref 0.0–0.2)

## 2021-05-27 LAB — URINALYSIS, ROUTINE W REFLEX MICROSCOPIC
Bilirubin Urine: NEGATIVE
Glucose, UA: NEGATIVE mg/dL
Hgb urine dipstick: NEGATIVE
Ketones, ur: 5 mg/dL — AB
Leukocytes,Ua: NEGATIVE
Nitrite: NEGATIVE
Protein, ur: 100 mg/dL — AB
Specific Gravity, Urine: 1.033 — ABNORMAL HIGH (ref 1.005–1.030)
pH: 5 (ref 5.0–8.0)

## 2021-05-27 LAB — COMPREHENSIVE METABOLIC PANEL
ALT: 14 U/L (ref 0–44)
AST: 19 U/L (ref 15–41)
Albumin: 2.7 g/dL — ABNORMAL LOW (ref 3.5–5.0)
Alkaline Phosphatase: 108 U/L (ref 38–126)
Anion gap: 10 (ref 5–15)
BUN: 14 mg/dL (ref 6–20)
CO2: 18 mmol/L — ABNORMAL LOW (ref 22–32)
Calcium: 9.4 mg/dL (ref 8.9–10.3)
Chloride: 109 mmol/L (ref 98–111)
Creatinine, Ser: 0.93 mg/dL (ref 0.44–1.00)
GFR, Estimated: >60 mL/min (ref >60)
Glucose, Bld: 87 mg/dL (ref 70–99)
Potassium: 3.8 mmol/L (ref 3.5–5.1)
Sodium: 137 mmol/L (ref 135–145)
Total Bilirubin: 0.4 mg/dL (ref 0.3–1.2)
Total Protein: 6.5 g/dL (ref 6.5–8.1)

## 2021-05-27 LAB — PROTEIN / CREATININE RATIO, URINE
Creatinine, Urine: 499 mg/dL
Protein Creatinine Ratio: 0.75 mg/mg{Cre} — ABNORMAL HIGH (ref 0.00–0.15)
Total Protein, Urine: 376 mg/dL

## 2021-05-27 MED ORDER — HYDRALAZINE HCL 20 MG/ML IJ SOLN: 10.0000 mg | INTRAMUSCULAR | Status: DC | PRN

## 2021-05-27 MED ORDER — LABETALOL HCL 5 MG/ML IV SOLN
20.0000 mg | INTRAVENOUS | Status: DC | PRN
Start: 2021-05-27 — End: 2021-05-28

## 2021-05-27 MED ORDER — NIFEDIPINE ER 30 MG PO TB24: 30.0000 mg | ORAL_TABLET | Freq: Every day | ORAL | 2 refills | Status: AC

## 2021-05-27 MED ORDER — LABETALOL HCL 5 MG/ML IV SOLN: 40.0000 mg | INTRAVENOUS | Status: DC | PRN

## 2021-05-27 MED ORDER — NIFEDIPINE ER OSMOTIC RELEASE 30 MG PO TB24
30.0000 mg | ORAL_TABLET | Freq: Every day | ORAL | Status: DC
  Administered 2021-05-27: 30 mg via ORAL
  Filled 2021-05-27: qty 1

## 2021-05-27 MED ORDER — LABETALOL HCL 5 MG/ML IV SOLN: 80.0000 mg | INTRAVENOUS | Status: DC | PRN

## 2021-05-27 NOTE — MAU Provider Note (Signed)
?History  ?  ? ?CSN: [id number] ? ?Arrival date and time: 05/27/21 1647 ? ? Event Date/Time  ? First Provider Initiated Contact with Patient 05/27/21 1756   ?  ? ?Chief Complaint  ?Patient presents with  ? Hypertension  ? ?Cindy Kelley is a 21 y.o. G3P0020 at 35w1dwho receives care at [facility].  She presents today for Hypertension. She was seen in the office and elevated BP noted.  Patient denies history of HTN, but goes on to state that her bp is "normal most of the time."  Patient denies HA, visual disturbances, SOB, Edema, or RUQ pain.  She endorses fetal movement and denies abdominal cramping.  She reports some occasional BH contractions.   ? ? ?OB History   ? ? Gravida  ?3  ? Para  ?0  ? Term  ?0  ? Preterm  ?0  ? AB  ?2  ? Living  ?0  ?  ? ? SAB  ?1  ? IAB  ?1  ? Ectopic  ?0  ? Multiple  ?0  ? Live Births  ?0  ?   ?  ?  ? ? ?Past Medical History:  ?Diagnosis Date  ? Hx of osteosarcoma   ? Age 89 ? Hypertension   ? ? ?Past Surgical History:  ?Procedure Laterality Date  ? REPLACEMENT TOTAL KNEE Right   ? 2015, 2017, 2019  ? ? ?History reviewed. No pertinent family history. ? ?Social History  ? ?Tobacco Use  ? Smoking status: Never  ? Smokeless tobacco: Never  ?Vaping Use  ? Vaping Use: Never used  ?Substance Use Topics  ? Alcohol use: Not Currently  ?  Comment: not since confirmed pregnancy  ? Drug use: Never  ? ? ?Allergies:  ?Allergies  ?Allergen Reactions  ? Peanut-Containing Drug Products Anaphylaxis  ? Wound Dressing Adhesive Itching and Rash  ? ? ?Medications Prior to Admission  ?Medication Sig Dispense Refill Last Dose  ? Prenat-FeFum-DSS-FA-DHA w/o A (PNV-DHA+DOCUSATE) 27-1.25-300 MG CAPS Take 1 capsule by mouth daily before breakfast. 90 capsule 4 05/26/2021  ? Blood Pressure Monitoring (BLOOD PRESSURE KIT) DEVI 1 kit by Does not apply route once a week. (Patient not taking: Reported on 05/20/2021) 1 each 0   ? Misc. Devices (GOJJI WEIGHT SCALE) MISC 1 Device by Does not apply route every 30  (thirty) days. (Patient not taking: Reported on 05/20/2021) 1 each 0   ? Prenat-Fe Carbonyl-FA-Omega 3 (ONE-A-DAY WOMENS PRENATAL 1 PO) Take 1 tablet by mouth daily. (Patient not taking: Reported on 05/20/2021)     ? ? ?Review of Systems  ?Constitutional:  Negative for chills and fever.  ?Eyes:  Negative for visual disturbance.  ?Respiratory:  Negative for shortness of breath.   ?Cardiovascular:  Negative for chest pain.  ?Gastrointestinal:  Negative for abdominal pain, nausea and vomiting.  ?Genitourinary:  Negative for difficulty urinating, dysuria, vaginal bleeding and vaginal discharge.  ?Musculoskeletal:  Negative for back pain.  ?Neurological:  Negative for dizziness, light-headedness and headaches.  ?Physical Exam  ? ?Blood pressure (!) 157/104, pulse 86, temperature 99.1 ?F (37.3 ?C), temperature source Oral, resp. rate 17, height 5' 6" (1.676 m), weight 84.8 kg, last menstrual period 10/28/2020, SpO2 99 %. ?Vitals:  ? 05/27/21 1714 05/27/21 1745 05/27/21 1801 05/27/21 1816  ?BP: (!) 150/107 (!) 157/104 (!) 149/100 (!) 157/94  ? 05/27/21 1831 05/27/21 1846 05/27/21 1901 05/27/21 1916  ?BP: (!) 146/95 (!) 147/100 (!) 146/98 (!) 145/98  ?  ? ?Physical  Exam ?Vitals reviewed. Exam conducted with a chaperone present.  ?Constitutional:   ?   Appearance: Normal appearance.  ?HENT:  ?   Head: Normocephalic and atraumatic.  ?Eyes:  ?   Conjunctiva/sclera: Conjunctivae normal.  ?Cardiovascular:  ?   Rate and Rhythm: Normal rate and regular rhythm.  ?   Heart sounds: Normal heart sounds.  ?Pulmonary:  ?   Effort: Pulmonary effort is normal. No respiratory distress.  ?   Breath sounds: Normal breath sounds.  ?Abdominal:  ?   Palpations: Abdomen is soft.  ?   Tenderness: There is no abdominal tenderness.  ?Musculoskeletal:     ?   General: Normal range of motion.  ?   Cervical back: Normal range of motion.  ?Skin: ?   General: Skin is warm.  ?Neurological:  ?   Mental Status: She is alert and oriented to person, place, and  time.  ?Psychiatric:     ?   Mood and Affect: Mood normal.     ?   Behavior: Behavior normal.  ? ? ?Fetal Assessment ?145 bpm, Mod Var, + Variable Decel noted with ctx, +Accels ?Toco: Occasional ctx graphed ? ?MAU Course  ? ?Results for orders placed or performed during the hospital encounter of 05/27/21 (from the past 24 hour(s))  ?Urinalysis, Routine w reflex microscopic Urine, Clean Catch     Status: Abnormal  ? Collection Time: 05/27/21  5:25 PM  ?Result Value Ref Range  ? Color, Urine YELLOW YELLOW  ? APPearance HAZY (A) CLEAR  ? Specific Gravity, Urine 1.033 (H) 1.005 - 1.030  ? pH 5.0 5.0 - 8.0  ? Glucose, UA NEGATIVE NEGATIVE mg/dL  ? Hgb urine dipstick NEGATIVE NEGATIVE  ? Bilirubin Urine NEGATIVE NEGATIVE  ? Ketones, ur 5 (A) NEGATIVE mg/dL  ? Protein, ur 100 (A) NEGATIVE mg/dL  ? Nitrite NEGATIVE NEGATIVE  ? Leukocytes,Ua NEGATIVE NEGATIVE  ? RBC / HPF 0-5 0 - 5 RBC/hpf  ? WBC, UA 6-10 0 - 5 WBC/hpf  ? Bacteria, UA RARE (A) NONE SEEN  ? Squamous Epithelial / LPF 11-20 0 - 5  ? Mucus PRESENT   ?Protein / creatinine ratio, urine     Status: Abnormal  ? Collection Time: 05/27/21  5:25 PM  ?Result Value Ref Range  ? Creatinine, Urine 499 mg/dL  ? Total Protein, Urine 376 mg/dL  ? Protein Creatinine Ratio 0.75 (H) 0.00 - 0.15 mg/mg[Cre]  ?CBC     Status: None  ? Collection Time: 05/27/21  5:57 PM  ?Result Value Ref Range  ? WBC 8.4 4.0 - 10.5 K/uL  ? RBC 4.36 3.87 - 5.11 MIL/uL  ? Hemoglobin 12.2 12.0 - 15.0 g/dL  ? HCT 36.5 36.0 - 46.0 %  ? MCV 83.7 80.0 - 100.0 fL  ? MCH 28.0 26.0 - 34.0 pg  ? MCHC 33.4 30.0 - 36.0 g/dL  ? RDW 15.1 11.5 - 15.5 %  ? Platelets 241 150 - 400 K/uL  ? nRBC 0.0 0.0 - 0.2 %  ?Comprehensive metabolic panel     Status: Abnormal  ? Collection Time: 05/27/21  5:57 PM  ?Result Value Ref Range  ? Sodium 137 135 - 145 mmol/L  ? Potassium 3.8 3.5 - 5.1 mmol/L  ? Chloride 109 98 - 111 mmol/L  ? CO2 18 (L) 22 - 32 mmol/L  ? Glucose, Bld 87 70 - 99 mg/dL  ? BUN 14 6 - 20 mg/dL  ? Creatinine,  Ser 0.93 0.44 - 1.00 mg/dL  ?   Calcium 9.4 8.9 - 10.3 mg/dL  ? Total Protein 6.5 6.5 - 8.1 g/dL  ? Albumin 2.7 (L) 3.5 - 5.0 g/dL  ? AST 19 15 - 41 U/L  ? ALT 14 0 - 44 U/L  ? Alkaline Phosphatase 108 38 - 126 U/L  ? Total Bilirubin 0.4 0.3 - 1.2 mg/dL  ? GFR, Estimated >60 >60 mL/min  ? Anion gap 10 5 - 15  ? ?No results found. ? ?MDM ?Physical Exam ?Start IV ?Labs: CBC, CMP, PC Ratio ?Measure BPQ15 min ?EFM ?Antihypertensive ?Assessment and Plan  ?21 year old G3P0020  ?SIUP at 30.1 weeks ?Cat I FT-Overall ?Hypertension-Likely Chronic ? ? ?-POC reviewed ?-Exam performed. ?-Start IV and lock for anticipated access. ?-PreE protocol ordered. ?-Given procardia 69m now. ?-Labs per protocol ?-NST overall reassuring for GA. ?-Monitor and await results.  ? ?JMaryann ConnersMSN, CNM ?05/27/2021, 5:56 PM  ? ? ?Reassessment (7:38 PM) ? ?Vitals:  ? 05/27/21 1926 05/27/21 1931 05/27/21 1936 05/27/21 1941  ?BP:  (!) 138/94    ?Pulse:  60    ?Resp:      ?Temp:      ?TempSrc:      ?SpO2: 100% 100% 100% 100%  ?Weight:      ?Height:      ? ?-Results return as above. ?-BP remain elevated, but improving s/p Procardia dosing. ?-Dr. LLittle Ishikawaconsulted and informed of patient status, evaluation, interventions, and results. Advised: ?*Give diagnosis of CHTN with SuperImposed PreEclampsia ?*Continue Procardia 375mdosing daily. ?*Send message for BP check on Friday May 12th in office. ?*Order for antenatal testing. ?-Provider to bedside to discuss POC with patient. ?-Patient visually upset with diagnosis and reassurance given.  ?-Informed that medication may only be necessary during pregnancy, but close follow up with PCP after delivery will also be necessary. ?-PreEclampsia symptoms and precautions given. ?-Cautioned that procardia may cause rebound HA. Informed of safety of tylenol for treatment.  ?-Message sent to office for BP check. ?-Order placed for Growth [place] BPP. ?-Encouraged to call primary office or return to MAU if symptoms  worsen or with the onset of new symptoms. ?-Discharged to home in stable condition. ? ?JeMaryann ConnersSN, CNM ?Advanced Practice Provider, Center for [employer] ?

## 2021-05-27 NOTE — Progress Notes (Signed)
? ?  PRENATAL VISIT NOTE ? ?Subjective:  ?Cindy Kelley is a 21 y.o. G3P0020 at [redacted]w[redacted]d being seen today for ongoing prenatal care.  She is currently monitored for the following issues for this high-risk pregnancy and has Supervision of normal pregnancy; Alpha thalassemia silent carrier; and Mild preeclampsia on their problem list. ? ?Patient reports headache.  Contractions: Not present. Vag. Bleeding: None.  Movement: Present. Denies leaking of fluid.  ? ?The following portions of the patient's history were reviewed and updated as appropriate: allergies, current medications, past family history, past medical history, past social history, past surgical history and problem list.  ? ?Objective:  ? ?Vitals:  ? 05/27/21 1503  ?BP: (!) 151/112  ?Pulse: 72  ?Weight: 187 lb (84.8 kg)  ? ? ?Fetal Status: Fetal Heart Rate (bpm): 144   Movement: Present    ? ?General:  Alert, oriented and cooperative. Patient is in no acute distress.  ?Skin: Skin is warm and dry. No rash noted.   ?Cardiovascular: Normal heart rate noted  ?Respiratory: Normal respiratory effort, no problems with respiration noted  ?Abdomen: Soft, gravid, appropriate for gestational age.  Pain/Pressure: Absent     ?Pelvic: Cervical exam deferred        ?Extremities: Normal range of motion.  Edema: None  ?Mental Status: Normal mood and affect. Normal behavior. Normal judgment and thought content.  ? ?Assessment and Plan:  ?Pregnancy: G3P0020 at [redacted]w[redacted]d ?1. Encounter for supervision of other normal pregnancy in third trimester ? ? ?2. Mild pre-eclampsia in third trimester ?Possible severe preeclampsia, CHTN with superimposed preeclampsia ?To MAU, Milinda Cave CNM aware ?Preterm labor symptoms and general obstetric precautions including but not limited to vaginal bleeding, contractions, leaking of fluid and fetal movement were reviewed in detail with the patient. ?Please refer to After Visit Summary for other counseling recommendations.  ? ?Return in about 1 week  (around 06/03/2021). ? ?Future Appointments  ?Date Time Provider [place]  ?05/27/2021  3:30 PM Lynnea Ferrier, LCSW [facility] None  ? ? ?Emeterio Reeve, MD ?

## 2021-05-27 NOTE — MAU Note (Signed)
Denajah Farias is a 21 y.o. at [redacted]w[redacted]d here in MAU reporting: had prenatal appt, BP was high.  Has been high for over a wk.  Sent in for blood work and further monitoring. Denies HA, visual changes, epigastric pain or increase in swelling. Denies bleeding , LOF, reports +FM ? ?Onset of complaint: 2wks ?Pain score: none ?Vitals:  ? 05/27/21 1714  ?BP: (!) 150/107  ?Pulse: 94  ?Resp: 17  ?Temp: 99.1 ?F (37.3 ?C)  ?SpO2: 100%  ?   ?FHT:158 ?Lab orders placed from triage:  urine/ pcr ?

## 2021-05-27 NOTE — Progress Notes (Signed)
ROB 30 wks ?Some UC's, mild, not sustained ?Some visual changes/ floaters ?No HA, dizziness, RUQ pain ?

## 2021-05-27 NOTE — BH Specialist Note (Signed)
Integrated Behavioral Health Initial In-Person Visit ? ?MRN: [id number] ?Name: Cindy Kelley ? ?Number of Integrated Behavioral Health Clinician visits: 1 ?Session Start time:   330PM ?Session End time: 349PM ?Total time in minutes: 19 mins in person at [facility]  ? ?Types of Service: Individual psychotherapy ? ?Interpretor:No. Interpretor Name and Language: none ? ? Warm Hand Off Completed. ? ?  ? ?  ? ? ?Subjective: ?Cindy Kelley is a 21 y.o. female accompanied by Partner/Significant Other ?Patient was referred by Dr Debroah Loop for anxious mood . ?Patient reports the following symptoms/concerns: anxious mood ?Duration of problem: approx 3 weeks ; Severity of problem: mild ? ?Objective: ?Mood: good  and Affect: Appropriate ?Risk of harm to self or others: No plan to harm self or others ? ?Life Context: ?Family and Social: Lives with family in [place]  ?School/Work: Temp work ?Self-Care: n/a ?Life Changes: New pregnancy ? ?Patient and/or Family's Strengths/Protective Factors: ?Concrete supports in place (healthy food, safe environments, etc.) ? ?Goals Addressed: ?Patient will: ?Reduce symptoms of: anxiety ?Increase knowledge and/or ability of: coping skills  ?Demonstrate ability to: Increase healthy adjustment to current life circumstances ? ?Progress towards Goals: ?Ongoing ? ?Interventions: ?Interventions utilized: Supportive Counseling  ?Standardized Assessments completed: PHQ 9 ? ?Patient and/or Family Response: Ms. Whitsel responded well to visit  ? ? ?Assessment: ?Patient currently experiencing adjustment disorder with anxious mood. ?  ?Patient may benefit from integrated behavioral health. ? ?Plan: ?Follow up with behavioral health clinician on : prn ?Behavioral recommendations: attend waterbirth class, communicate need with spouse, prioritize rest, engage in self care  ?Referral(s): Integrated [employer] (In Clinic) ?"From scale of 1-10, how likely are you to follow plan?":    ? ?Gwyndolyn Saxon, LCSW ? ? ? ? ? ? ? ? ?

## 2021-05-28 ENCOUNTER — Inpatient Hospital Stay (HOSPITAL_COMMUNITY)
Admission: AD | Admit: 2021-05-28 | Discharge: 2021-05-28 | Disposition: A | Discharge status: Home / Self Care | Payer: BC Managed Care – PPO | Attending: Obstetrics and Gynecology | Admitting: Obstetrics and Gynecology

## 2021-05-28 ENCOUNTER — Encounter (HOSPITAL_COMMUNITY): Payer: Self-pay | Admitting: Obstetrics and Gynecology

## 2021-05-28 DIAGNOSIS — Z3A30 30 weeks gestation of pregnancy: Secondary | ICD-10-CM | POA: Insufficient documentation

## 2021-05-28 DIAGNOSIS — R519 Headache, unspecified: Secondary | ICD-10-CM | POA: Diagnosis present

## 2021-05-28 DIAGNOSIS — O10013 Pre-existing essential hypertension complicating pregnancy, third trimester: Secondary | ICD-10-CM | POA: Insufficient documentation

## 2021-05-28 DIAGNOSIS — O26899 Other specified pregnancy related conditions, unspecified trimester: Secondary | ICD-10-CM

## 2021-05-28 DIAGNOSIS — O119 Pre-existing hypertension with pre-eclampsia, unspecified trimester: Secondary | ICD-10-CM

## 2021-05-28 MED ORDER — METOCLOPRAMIDE HCL 10 MG PO TABS: 10.0000 mg | ORAL_TABLET | Freq: Four times a day (QID) | ORAL | 0 refills | Status: AC | PRN

## 2021-05-28 MED ORDER — HYDRALAZINE HCL 20 MG/ML IJ SOLN: 5.0000 mg | INTRAMUSCULAR | Status: DC | PRN

## 2021-05-28 MED ORDER — LABETALOL HCL 5 MG/ML IV SOLN: 40.0000 mg | INTRAVENOUS | Status: DC | PRN

## 2021-05-28 MED ORDER — DIPHENHYDRAMINE HCL 25 MG PO TABS: 25.0000 mg | ORAL_TABLET | Freq: Four times a day (QID) | ORAL | 0 refills | Status: AC | PRN

## 2021-05-28 MED ORDER — CAFFEINE 200 MG PO TABS
100.0000 mg | ORAL_TABLET | Freq: Once | ORAL | Status: AC
  Administered 2021-05-28: 100 mg via ORAL
  Filled 2021-05-28: qty 1

## 2021-05-28 MED ORDER — LABETALOL HCL 5 MG/ML IV SOLN: 20.0000 mg | INTRAVENOUS | Status: DC | PRN

## 2021-05-28 MED ORDER — METOCLOPRAMIDE HCL 5 MG/ML IJ SOLN
10.0000 mg | Freq: Once | INTRAMUSCULAR | Status: AC
  Administered 2021-05-28: 10 mg via INTRAVENOUS
  Filled 2021-05-28: qty 2

## 2021-05-28 MED ORDER — HYDRALAZINE HCL 20 MG/ML IJ SOLN: 10.0000 mg | INTRAMUSCULAR | Status: DC | PRN

## 2021-05-28 MED ORDER — METOCLOPRAMIDE HCL 10 MG PO TABS
10.0000 mg | ORAL_TABLET | Freq: Once | ORAL | Status: DC
Start: 2021-05-28 — End: 2021-05-28
  Filled 2021-05-28: qty 1

## 2021-05-28 MED ORDER — DIPHENHYDRAMINE HCL 25 MG PO CAPS
25.0000 mg | ORAL_CAPSULE | Freq: Once | ORAL | Status: AC
Start: 2021-05-28 — End: 2021-05-28
  Administered 2021-05-28: 25 mg via ORAL
  Filled 2021-05-28 (×2): qty 1

## 2021-05-28 NOTE — MAU Provider Note (Addendum)
Chief Complaint:  Headache and Hypertension ? ? Event Date/Time  ? First Provider Initiated Contact with Patient 05/28/21 [phone]   ? HPI: Cindy Kelley is a 21 y.o. G3P0020 at 81w2dwho presents to maternity admissions reporting headache since 2200.  Tylenol "took the edge off" but did not relieve it.  Denies other new symptoms. ?She reports good fetal movement, denies LOF, vaginal bleeding, n/v, or fever/chills.  She denies visual changes or RUQ abdominal pain. ? ?Was given Procardia at 6pm while here. ? ?Headache  ?This is a new problem. The current episode started today. The problem occurs constantly. The problem has been unchanged. The quality of the pain is described as aching. Associated symptoms include photophobia. Pertinent negatives include no abdominal pain, back pain, blurred vision, dizziness, fever or muscle aches. The symptoms are aggravated by bright light. She has tried acetaminophen for the symptoms. The treatment provided mild relief. Her past medical history is significant for hypertension.  ?Hypertension ?This is a recurrent problem. Associated symptoms include headaches. Pertinent negatives include no blurred vision. Treatments tried: Nifedipine. There are no compliance problems.   ? ? ?RN Note: ?Cindy Kelley is a 21 y.o. at [redacted]w[redacted]d here in MAU reporting: severe HA since 2200. Pt states she took Tylenol 500mg  around 0000 and again at 0100 today. Pt took her BP it was 140s/90s around 2300 yesterday. Pt was in MAU yesterday for elevated BP and was discharged. Pt denies DFM, VB, LOF, abnormal discharge, other PIH s/s including epigastic pain, vision changes, and edema.  ? ?Past Medical History: ?Past Medical History:  ?Diagnosis Date  ? Hx of osteosarcoma   ? Age 47  ? Hypertension   ? ? ?Past obstetric history: ?OB History  ?Gravida Para Term Preterm AB Living  ?3 0 0 0 2 0  ?SAB IAB Ectopic Multiple Live Births  ?1 1 0 0 0  ?  ?# Outcome Date GA Lbr Len/2nd Weight Sex Delivery Anes  PTL Lv  ?3 Current           ?2 IAB           ?1 SAB           ? ? ?Past Surgical History: ?Past Surgical History:  ?Procedure Laterality Date  ? REPLACEMENT TOTAL KNEE Right   ? 2015, 2017, 2019  ? ? ?Family History: ?No family history on file. ? ?Social History: ?Social History  ? ?Tobacco Use  ? Smoking status: Never  ? Smokeless tobacco: Never  ?Vaping Use  ? Vaping Use: Never used  ?Substance Use Topics  ? Alcohol use: Not Currently  ?  Comment: not since confirmed pregnancy  ? Drug use: Never  ? ? ?Allergies:  ?Allergies  ?Allergen Reactions  ? Peanut-Containing Drug Products Anaphylaxis  ? Wound Dressing Adhesive Itching and Rash  ? ? ?Meds:  ?Medications Prior to Admission  ?Medication Sig Dispense Refill Last Dose  ? NIFEdipine (ADALAT CC) 30 MG 24 hr tablet Take 1 tablet (30 mg total) by mouth daily. 60 tablet 2   ? Prenat-FeFum-DSS-FA-DHA w/o A (PNV-DHA+DOCUSATE) 27-1.25-300 MG CAPS Take 1 capsule by mouth daily before breakfast. 90 capsule 4   ? ? ?I have reviewed patient's Past Medical Hx, Surgical Hx, Family Hx, Social Hx, medications and allergies.  ? ?ROS:  ?Review of Systems  ?Constitutional:  Negative for fever.  ?Eyes:  Positive for photophobia. Negative for blurred vision.  ?Gastrointestinal:  Negative for abdominal pain.  ?Musculoskeletal:  Negative for back pain.  ?  Neurological:  Positive for headaches. Negative for dizziness.  ?Other systems negative ? ?Physical Exam  ?Patient Vitals for the past 24 hrs: ? BP Temp Temp src Pulse Resp SpO2 Height Weight  ?05/28/21 0413 (!) 148/108 97.9 ?F (36.6 ?C) Oral (!) 101 20 100 % 5\' 6"  (1.676 m) 85.1 kg  ? ?155/105 Abnormal  -- 99 % -- -- -- -- MB       ?05/28/21 0500 -- 84 -- -- 148/105 Abnormal  -- 98 % -- -- -- -- MB  ?05/28/21 0445 -- -- -- -- -- -- 99 % -- -- -- -- MB  ?05/28/21 0430 -- 93 -- -- 152/109 Abnormal  -- 100 % -- -- -- -- MB  ?05/28/21 0413 97.9 ?F (36.6 ?C) 101 Abnormal  -- 20 148/108         ? ?Vitals:  ? 05/28/21 0533 05/28/21 0545  05/28/21 0600 05/28/21 0618  ?BP: (!) 139/91 134/87 (!) 146/93 140/90  ?Pulse: 79 69 91 88  ?Resp:      ?Temp:      ?TempSrc:      ?SpO2:      ?Weight:      ?Height:      ? ? ?Constitutional: Well-developed, well-nourished female in no acute distress.  ?Cardiovascular: normal rate and rhythm ?Respiratory: normal effort, clear to auscultation bilaterally ?GI: Abd soft, non-tender, gravid appropriate for gestational age.   No rebound or guarding. ?MS: Extremities nontender, no edema, normal ROM ?Neurologic: Alert and oriented x 4.  ?GU: Neg CVAT. ?  ?FHT:  Baseline 140 , moderate variability, accelerations present, no decelerations ?Contractions:  Rare ?  ?Labs (from earlier today): ?Results for orders placed or performed during the hospital encounter of 05/27/21 (from the past 24 hour(s))  ?Urinalysis, Routine w reflex microscopic Urine, Clean Catch     Status: Abnormal  ? Collection Time: 05/27/21  5:25 PM  ?Result Value Ref Range  ? Color, Urine YELLOW YELLOW  ? APPearance HAZY (A) CLEAR  ? Specific Gravity, Urine 1.033 (H) 1.005 - 1.030  ? pH 5.0 5.0 - 8.0  ? Glucose, UA NEGATIVE NEGATIVE mg/dL  ? Hgb urine dipstick NEGATIVE NEGATIVE  ? Bilirubin Urine NEGATIVE NEGATIVE  ? Ketones, ur 5 (A) NEGATIVE mg/dL  ? Protein, ur 100 (A) NEGATIVE mg/dL  ? Nitrite NEGATIVE NEGATIVE  ? Leukocytes,Ua NEGATIVE NEGATIVE  ? RBC / HPF 0-5 0 - 5 RBC/hpf  ? WBC, UA 6-10 0 - 5 WBC/hpf  ? Bacteria, UA RARE (A) NONE SEEN  ? Squamous Epithelial / LPF 11-20 0 - 5  ? Mucus PRESENT   ?Protein / creatinine ratio, urine     Status: Abnormal  ? Collection Time: 05/27/21  5:25 PM  ?Result Value Ref Range  ? Creatinine, Urine 499 mg/dL  ? Total Protein, Urine 376 mg/dL  ? Protein Creatinine Ratio 0.75 (H) 0.00 - 0.15 mg/mg[Cre]  ?CBC     Status: None  ? Collection Time: 05/27/21  5:57 PM  ?Result Value Ref Range  ? WBC 8.4 4.0 - 10.5 K/uL  ? RBC 4.36 3.87 - 5.11 MIL/uL  ? Hemoglobin 12.2 12.0 - 15.0 g/dL  ? HCT 36.5 36.0 - 46.0 %  ? MCV 83.7  80.0 - 100.0 fL  ? MCH 28.0 26.0 - 34.0 pg  ? MCHC 33.4 30.0 - 36.0 g/dL  ? RDW 15.1 11.5 - 15.5 %  ? Platelets 241 150 - 400 K/uL  ? nRBC 0.0 0.0 - 0.2 %  ?  Comprehensive metabolic panel     Status: Abnormal  ? Collection Time: 05/27/21  5:57 PM  ?Result Value Ref Range  ? Sodium 137 135 - 145 mmol/L  ? Potassium 3.8 3.5 - 5.1 mmol/L  ? Chloride 109 98 - 111 mmol/L  ? CO2 18 (L) 22 - 32 mmol/L  ? Glucose, Bld 87 70 - 99 mg/dL  ? BUN 14 6 - 20 mg/dL  ? Creatinine, Ser 0.93 0.44 - 1.00 mg/dL  ? Calcium 9.4 8.9 - 10.3 mg/dL  ? Total Protein 6.5 6.5 - 8.1 g/dL  ? Albumin 2.7 (L) 3.5 - 5.0 g/dL  ? AST 19 15 - 41 U/L  ? ALT 14 0 - 44 U/L  ? Alkaline Phosphatase 108 38 - 126 U/L  ? Total Bilirubin 0.4 0.3 - 1.2 mg/dL  ? GFR, Estimated >60 >60 mL/min  ? Anion gap 10 5 - 15  ? ?O/Positive/-- (01/18 1423) ? ?Imaging:  ?No results found. ? ?MAU Course/MDM: ?I have reviewed the triage vital signs and the nursing notes. ?  ?Pertinent labs & imaging results that were available during my care of the patient were reviewed by me and considered in my medical decision making (see chart for details).      I have reviewed her medical records including past results, notes and treatments.  ? ?NST reviewed, reassuring and reactive  ?Consult Dr Elgie Congo with presentation, exam findings and test results.  ?Treatments in MAU included IV Reglan, PO Benadryl and Caffeine.   ?These relieved her headache from an 98 to a 3, and BPs came down afterward also ? ?Assessment: ?Single IUP at [redacted]w[redacted]d ?Chronic Hypertension with S/O Preeclampsia  ?Near-Severe range Hypertension, improved with pain relief ?Severe headache, improved with Reglan/Benadryl ? ?Plan: ?Discharge home ?Continue Procardia as ordered ?Rx Reglan and Benadryl prn migraine ?Strict Preeclampsia precautions ? ?Preterm Labor precautions and fetal kick counts ?Follow up in Office for prenatal visits and recheck ? ? ?Pt stable at time of discharge. ? ?Hansel Feinstein CNM, MSN ?Certified  Nurse-Midwife ?05/28/2021 ?4:28 AM ?

## 2021-05-28 NOTE — MAU Note (Signed)
.  Cindy Kelley is a 21 y.o. at [redacted]w[redacted]d here in MAU reporting: severe HA since 2200. Pt states she took Tylenol 500mg  around 0000 and again at 0100 today. Pt took her BP it was 140s/90s around 2300 yesterday. Pt was in MAU yesterday for elevated BP and was discharged. Pt denies DFM, VB, LOF, abnormal discharge, other PIH s/s including epigastic pain, vision changes, and edema.  ? ?Onset of complaint: 2200 yesterday ?Pain score: 6 ?Vitals:  ? 05/28/21 0413  ?BP: (!) 148/108  ?Pulse: (!) 101  ?Resp: 20  ?Temp: 97.9 ?F (36.6 ?C)  ?SpO2: 100%  ?   ?FHT:135 ?Lab orders placed from triage:  none ? ?

## 2021-05-29 ENCOUNTER — Ambulatory Visit (INDEPENDENT_AMBULATORY_CARE_PROVIDER_SITE_OTHER): Payer: BC Managed Care – PPO | Admitting: Emergency Medicine

## 2021-05-29 DIAGNOSIS — O0990 Supervision of high risk pregnancy, unspecified, unspecified trimester: Secondary | ICD-10-CM

## 2021-05-29 NOTE — Progress Notes (Signed)
Pt presents for BP check.  ?BP 134/91, P 102. Took procardia this morning. ? ?Reports intermittent headache, will begin managing with reglan and benedryl as prescribed on (5/11) ? ?No other complaints today, instructed to continue regimen. Next apt 5/18. ?

## 2021-05-30 NOTE — Progress Notes (Signed)
.  cwhrn °

## 2021-06-04 ENCOUNTER — Encounter: Payer: BC Managed Care – PPO | Admitting: Obstetrics

## 2021-06-11 ENCOUNTER — Ambulatory Visit (INDEPENDENT_AMBULATORY_CARE_PROVIDER_SITE_OTHER): Payer: BC Managed Care – PPO | Admitting: Obstetrics

## 2021-06-11 ENCOUNTER — Encounter: Payer: Self-pay | Admitting: Obstetrics

## 2021-06-11 VITALS — BP 129/82 | HR 108 | Wt 185.0 lb

## 2021-06-11 DIAGNOSIS — Z3A32 32 weeks gestation of pregnancy: Secondary | ICD-10-CM | POA: Diagnosis not present

## 2021-06-11 DIAGNOSIS — O0990 Supervision of high risk pregnancy, unspecified, unspecified trimester: Secondary | ICD-10-CM | POA: Diagnosis not present

## 2021-06-11 DIAGNOSIS — O119 Pre-existing hypertension with pre-eclampsia, unspecified trimester: Secondary | ICD-10-CM

## 2021-06-11 NOTE — Progress Notes (Signed)
Subjective:  Cindy Kelley is a 21 y.o. G3P0020 at [redacted]w[redacted]d being seen today for ongoing prenatal care.  She is currently monitored for the following issues for this high-risk pregnancy and has Supervision of high risk pregnancy, antepartum; Alpha thalassemia silent carrier; and Chronic hypertension with superimposed pre-eclampsia on their problem list.  Patient reports no complaints.  Contractions: Not present. Vag. Bleeding: None.  Movement: Present. Denies leaking of fluid.   The following portions of the patient's history were reviewed and updated as appropriate: allergies, current medications, past family history, past medical history, past social history, past surgical history and problem list. Problem list updated.  Objective:   Vitals:   06/11/21 1445  BP: 129/82  Pulse: (!) 108  Weight: 185 lb (83.9 kg)    Fetal Status:     Movement: Present     General:  Alert, oriented and cooperative. Patient is in no acute distress.  Skin: Skin is warm and dry. No rash noted.   Cardiovascular: Normal heart rate noted  Respiratory: Normal respiratory effort, no problems with respiration noted  Abdomen: Soft, gravid, appropriate for gestational age. Pain/Pressure: Absent     Pelvic:  Cervical exam deferred        Extremities: Normal range of motion.     Mental Status: Normal mood and affect. Normal behavior. Normal judgment and thought content.   Urinalysis:      Assessment and Plan:  Pregnancy: G3P0020 at [redacted]w[redacted]d  1. Supervision of high risk pregnancy, antepartum  2. Chronic hypertension with superimposed pre-eclampsia - BP clinically stable on Nifedipine 30 XL po daily   Preterm labor symptoms and general obstetric precautions including but not limited to vaginal bleeding, contractions, leaking of fluid and fetal movement were reviewed in detail with the patient. Please refer to After Visit Summary for other counseling recommendations.   Return in about 2 weeks (around  06/25/2021) for [facility].   Brock Bad, MD  06/11/21

## 2021-06-22 ENCOUNTER — Ambulatory Visit: Payer: BC Managed Care – PPO | Admitting: *Deleted

## 2021-06-22 ENCOUNTER — Ambulatory Visit (HOSPITAL_BASED_OUTPATIENT_CLINIC_OR_DEPARTMENT_OTHER): Payer: BC Managed Care – PPO | Admitting: Obstetrics

## 2021-06-22 ENCOUNTER — Ambulatory Visit: Payer: BC Managed Care – PPO

## 2021-06-22 ENCOUNTER — Other Ambulatory Visit (HOSPITAL_COMMUNITY): Payer: Self-pay

## 2021-06-22 ENCOUNTER — Inpatient Hospital Stay (HOSPITAL_COMMUNITY)
Admission: AD | Admit: 2021-06-22 | Payer: BC Managed Care – PPO | Source: Home / Self Care | Admitting: Obstetrics and Gynecology

## 2021-06-22 VITALS — BP 143/103 | HR 87

## 2021-06-22 DIAGNOSIS — Z3A30 30 weeks gestation of pregnancy: Secondary | ICD-10-CM | POA: Insufficient documentation

## 2021-06-22 DIAGNOSIS — O365930 Maternal care for other known or suspected poor fetal growth, third trimester, not applicable or unspecified: Secondary | ICD-10-CM | POA: Diagnosis present

## 2021-06-22 DIAGNOSIS — O1413 Severe pre-eclampsia, third trimester: Secondary | ICD-10-CM | POA: Diagnosis not present

## 2021-06-22 DIAGNOSIS — O119 Pre-existing hypertension with pre-eclampsia, unspecified trimester: Secondary | ICD-10-CM | POA: Insufficient documentation

## 2021-06-22 DIAGNOSIS — O0990 Supervision of high risk pregnancy, unspecified, unspecified trimester: Secondary | ICD-10-CM | POA: Insufficient documentation

## 2021-06-22 DIAGNOSIS — Z3A33 33 weeks gestation of pregnancy: Secondary | ICD-10-CM | POA: Diagnosis not present

## 2021-06-22 NOTE — Progress Notes (Unsigned)
MFM recommended admission with plan for delivery following betamethasone administration. NICU census very high and Neonatologist on call Dr. Joycelyn Man recommended against admission due to staffing and to avoid transferring newborn to another facility.  Patient was contacted prior to her arrival at [facility] and was advised to present to a local hospital for admission. Patient reported feeling well and reported good fetal movement. Patient plans to go to [facility]. Voicemail message was left to the on call physician and patient information and plan of care was discussed with triage staff who is awaiting her arrival.

## 2021-06-22 NOTE — Progress Notes (Signed)
MFM Note  Cindy Kelley was seen for a follow up exam due to chronic hypertension.  The patient reports that her blood pressures have been elevated for the past 6 weeks or so.  She is currently treated with nifedipine.  Her blood pressures in our office today were 152/108 and 148/103.  She reports that she experienced headaches and saw some floaters yesterday.    On today's exam, the overall EFW of 2 pounds 12 ounces measures at less than the 1st percentile for her gestational age, indicating severe IUGR.  Oligohydramnios with a total AFI of 4.6 cm is noted.  A biophysical profile performed today due to fetal growth restriction was 6 out of 8.  She received a -2 for an absent 2 x 2 pocket of amniotic fluid.  Doppler studies of the umbilical arteries showed absent end-diastolic flow.  There were no signs of reversed flow noted today.  Due to severe IUGR with absent end-diastolic flow and severe range blood pressures, the patient was sent to the hospital for admission following today's ultrasound exam.  She should receive a complete course of antenatal corticosteroids and medications to control her blood pressures.    PIH labs and a P/C ratio should also be obtained.  Due to severe IUGR with absent end-diastolic flow, at her current gestational age, induction of labor should be started 12 hours after she receives the second dose of steroids.    The patient understands that her baby will require a NICU admission following delivery.    She should receive magnesium sulfate for maternal seizure prophylaxis for 24 hours after delivery   The patient and her partner stated that they understood everything that was discussed with them today and all of their questions have been answered.  A total of 20 minutes was spent counseling and coordinating the care for this patient.  Greater than 50% of the time was spent in direct face-to-face contact.

## 2021-06-24 ENCOUNTER — Telehealth: Payer: Self-pay | Admitting: Obstetrics and Gynecology

## 2021-06-24 DIAGNOSIS — O4100X0 Oligohydramnios, unspecified trimester, not applicable or unspecified: Secondary | ICD-10-CM | POA: Insufficient documentation

## 2021-06-24 DIAGNOSIS — O365990 Maternal care for other known or suspected poor fetal growth, unspecified trimester, not applicable or unspecified: Secondary | ICD-10-CM | POA: Insufficient documentation

## 2021-06-24 NOTE — Telephone Encounter (Signed)
Called patient as did not show for admission for antenatal steroids and IOL. She says she discovered our nicu was full so she presented to [facility] where she is currently hospitalized, delivered yesterday.

## 2021-06-25 ENCOUNTER — Encounter: Payer: BC Managed Care – PPO | Admitting: Obstetrics and Gynecology

## 2021-07-09 ENCOUNTER — Ambulatory Visit (INDEPENDENT_AMBULATORY_CARE_PROVIDER_SITE_OTHER): Payer: Medicaid Other | Admitting: Obstetrics and Gynecology

## 2021-07-09 ENCOUNTER — Ambulatory Visit: Payer: BC Managed Care – PPO | Admitting: Obstetrics and Gynecology

## 2021-07-09 ENCOUNTER — Ambulatory Visit: Payer: Medicaid Other | Admitting: Obstetrics and Gynecology

## 2021-07-09 VITALS — BP 116/77 | HR 87 | Wt 173.0 lb

## 2021-07-09 DIAGNOSIS — Z0130 Encounter for examination of blood pressure without abnormal findings: Secondary | ICD-10-CM | POA: Diagnosis not present

## 2021-07-09 NOTE — Progress Notes (Signed)
Patient here for a follow up.  She delivered via PC/S on 6/6 2/2 to fetal intolerance. She was delivered 2/2 chronic hypertension si Pre E w/ SF. [facility]. She had a follow up visit with them after delivery. She was told to come here for BP check.  She is taking Nifedipine 60 XL daily. She has no HA or changes in her vision.   Vitals:   07/09/21 1423  Weight: 173 lb (78.5 kg)    Vitals:   07/09/21 1423  BP: 116/77  Pulse: 87  Weight: 173 lb (78.5 kg)     1. Postpartum state 2. BP check   Continue procardia Schedule 4/5 week PP visit.  Duane Lope, NP 07/09/2021 3:03 PM

## 2021-07-09 NOTE — Progress Notes (Signed)
2 week PP, delivered at [facility] via c/s.  Here today for follow up.

## 2021-07-16 ENCOUNTER — Encounter: Payer: BC Managed Care – PPO | Admitting: Family Medicine

## 2021-07-23 ENCOUNTER — Encounter: Payer: BC Managed Care – PPO | Admitting: Obstetrics and Gynecology

## 2021-07-23 ENCOUNTER — Encounter: Payer: BC Managed Care – PPO | Admitting: Obstetrics

## 2021-08-13 ENCOUNTER — Ambulatory Visit: Payer: Medicaid Other | Admitting: Family Medicine

## 2021-11-13 ENCOUNTER — Telehealth: Payer: Self-pay

## 2021-11-13 NOTE — Telephone Encounter (Signed)
Called patient to discuss breast pump order that was received in the office. Patient delivered in June. Left vm to see if this was requested or still needed.

## 2021-11-16 ENCOUNTER — Telehealth: Payer: Self-pay

## 2021-11-16 NOTE — Telephone Encounter (Signed)
Breast pump order received from [facility].   Order signed and faxed back.  Confirmation received

## 2021-12-14 ENCOUNTER — Ambulatory Visit: Payer: Medicaid Other | Admitting: Advanced Practice Midwife

## 2023-02-10 IMAGING — US US MFM OB DETAIL+14 WK
1 series · 13 of 28 positions shown · non-contrast
Comparison: none

[Series 1: us mfm ob detail+14 wk · 13 of 150 slices shown]
[im 6/150]
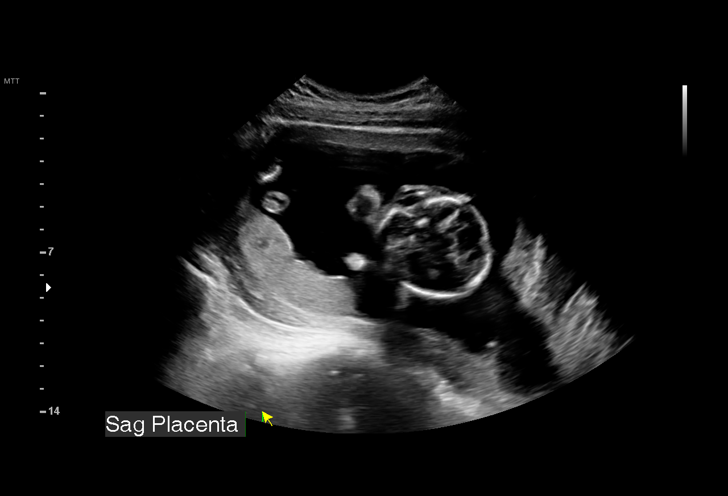
[im 17/150]
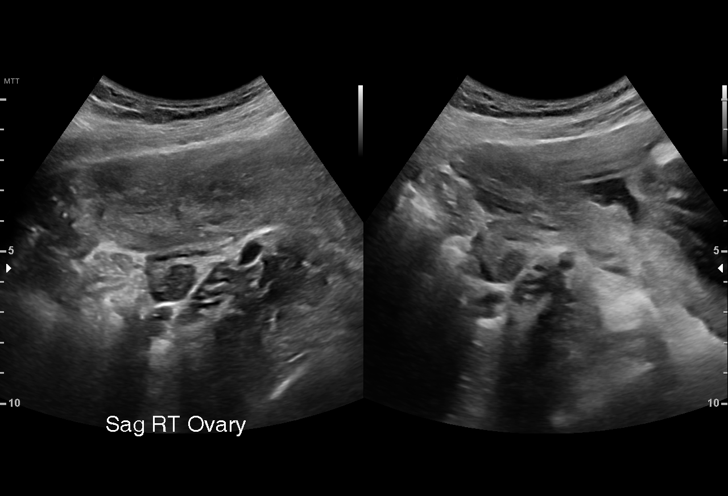
[im 28/150]
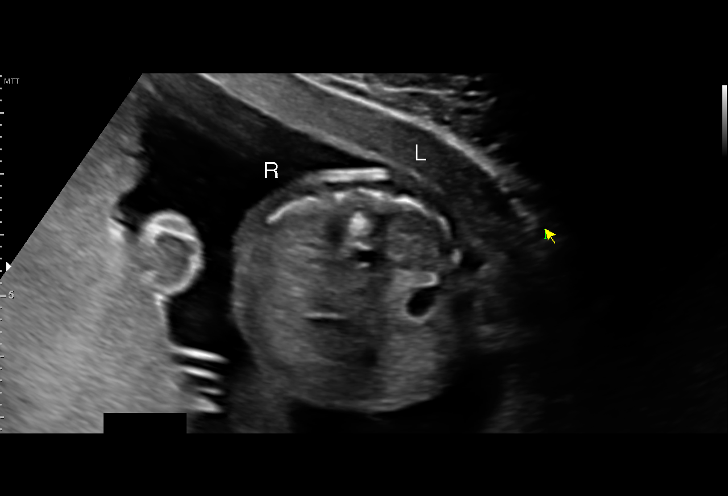
[im 39/150]
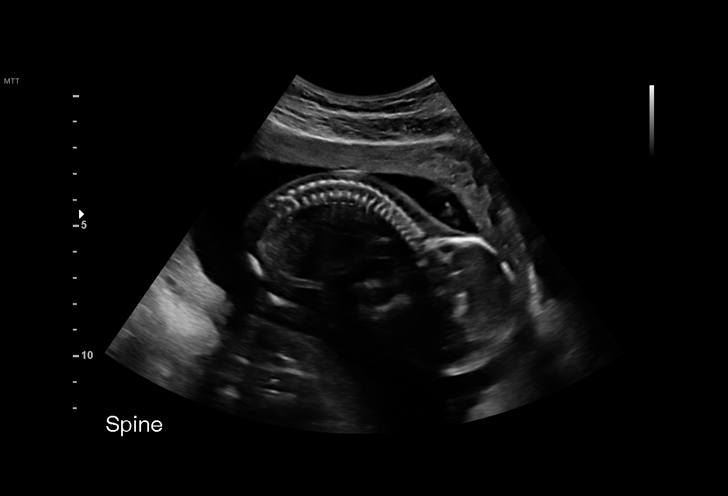
[im 50/150]
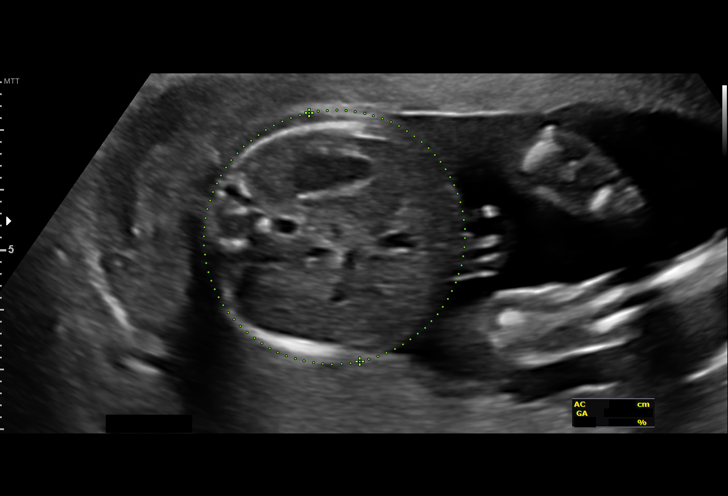
[im 61/150]
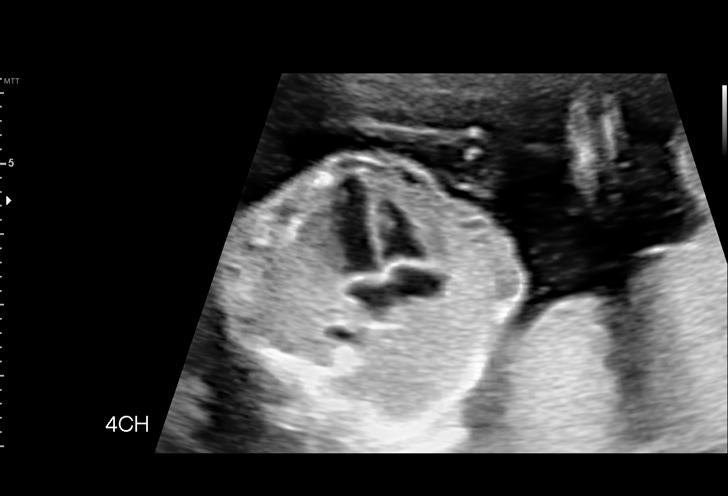
[im 78/150]
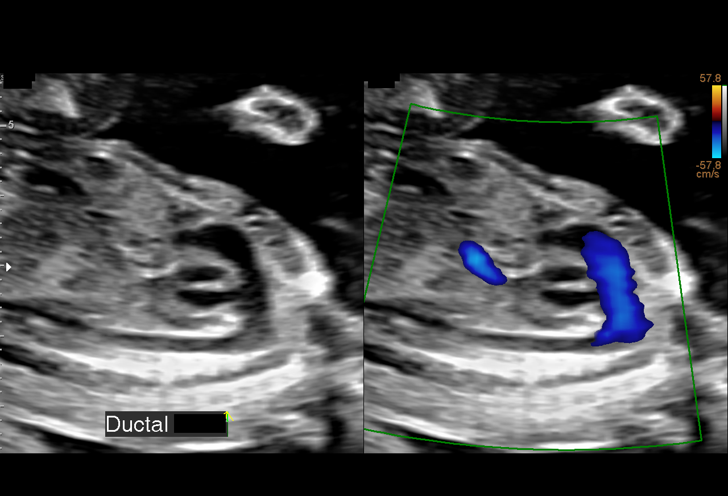
[im 89/150]
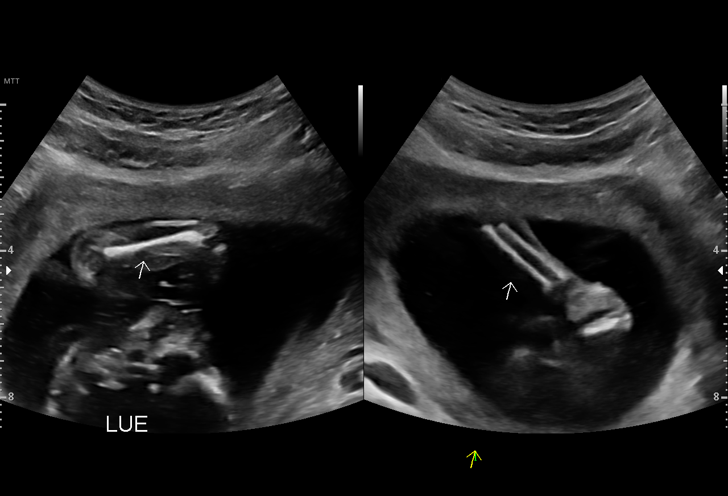
[im 100/150]
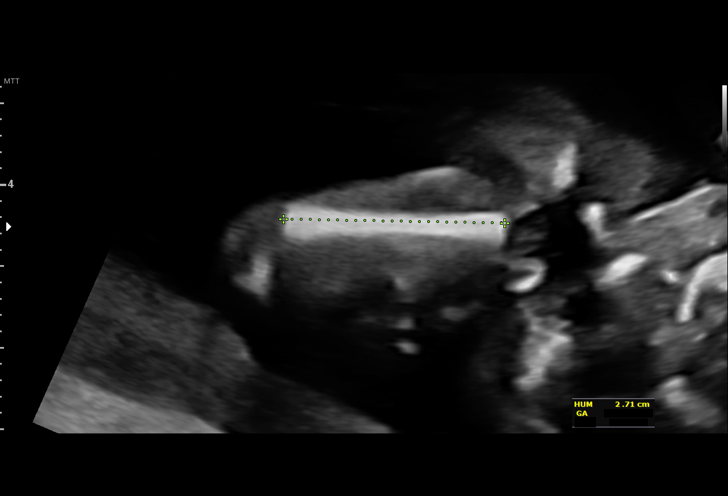
[im 111/150]
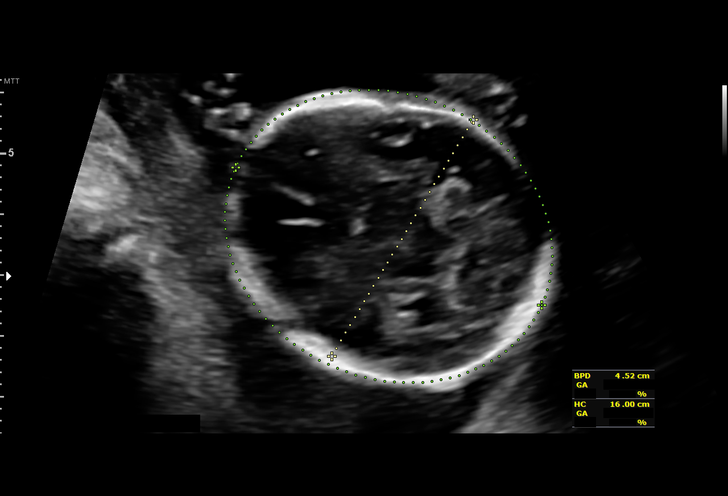
[im 122/150]
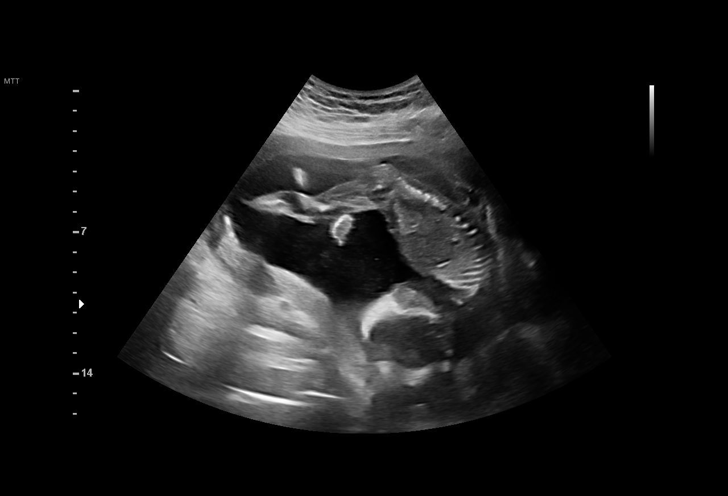
[im 133/150]
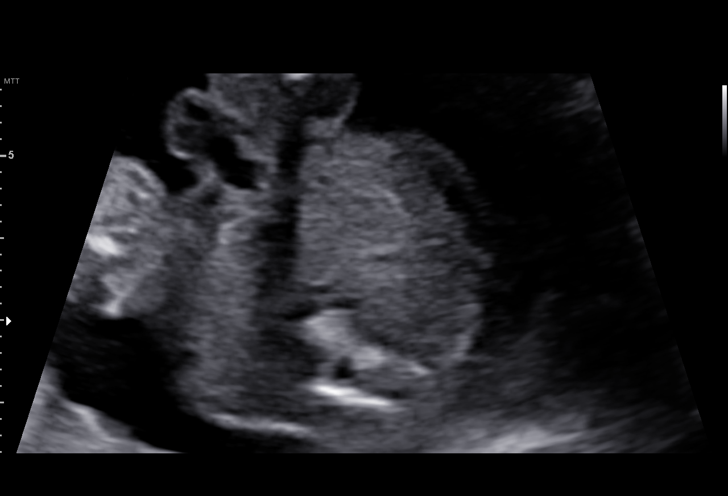
[im 144/150]
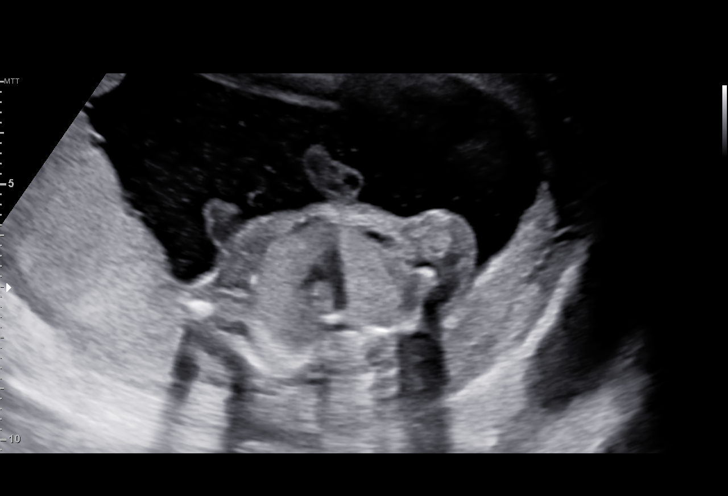

[13 of 28 positions shown; findings below may reference images not displayed]

[REDACTED]

Indications

 Fetal abnormality - other known or suspected
 Genetic carrier (CHELSEA Silent Carrier )
 19 weeks gestation of pregnancy
 LR NIPS
 Family history of congenital anomaly (FOB
 polydactyly)
 Encounter for antenatal screening for
 malformations
Fetal Evaluation

 Num Of Fetuses:         1
 Fetal Heart Rate(bpm):  141
 Cardiac Activity:       Observed
 Presentation:           Cephalic
 Placenta:               Posterior
 P. Cord Insertion:      Visualized, central

 Amniotic Fluid
 AFI FV:      Within normal limits

                             Largest Pocket(cm)

Biometry
 BPD:      44.9  mm     G. Age:  19w 4d         75  %    CI:        78.41   %    70 - 86
                                                         FL/HC:      16.5   %    16.1 -
 HC:      160.4  mm     G. Age:  18w 6d         35  %    HC/AC:      1.18        1.09 -
 AC:      136.4  mm     G. Age:  19w 1d         49  %    FL/BPD:     58.8   %
 FL:       26.4  mm     G. Age:  18w 0d         13  %    FL/AC:      19.4   %    20 - 24
 HUM:      27.1  mm     G. Age:  18w 4d         40  %
 CER:      20.7  mm     G. Age:  19w 6d         74  %
 NFT:       4.7  mm

 LV:        8.1  mm
 CM:        4.1  mm

 Est. FW:     250  gm      0 lb 9 oz     26  %
OB History

 Blood Type:   O+
 Gravidity:    3         Term:   0        Prem:   0        SAB:   1
 TOP:          1       Ectopic:  0        Living: 0
Gestational Age

 LMP:           19w 0d        Date:  10/28/20                 EDD:   08/04/21
 U/S Today:     18w 6d                                        EDD:   08/05/21
 Best:          19w 0d     Det. By:  LMP  (10/28/20)          EDD:   08/04/21
Anatomy

 Cranium:               Appears normal         Aortic Arch:            Appears normal
 Cavum:                 Appears normal         Ductal Arch:            Appears normal
 Ventricles:            Appears normal         Diaphragm:              Appears normal
 Choroid Plexus:        Appears normal         Stomach:                Appears normal, left
                                                                       sided
 Cerebellum:            Appears normal         Abdomen:                Appears normal
 Posterior Fossa:       Appears normal         Abdominal Wall:         Appears nml (cord
                                                                       insert, abd wall)
 Nuchal Fold:           Appears normal         Cord Vessels:           Appears normal (3
                                                                       vessel cord)
 Face:                  Appears normal         Kidneys:                Appear normal
                        (orbits and profile)
 Lips:                  Appears normal         Bladder:                Appears normal
 Thoracic:              Appears normal         Spine:                  Appears normal
 Heart:                 Appears normal         Upper Extremities:      Appears normal
                        (4CH, axis, and
                        situs)
 RVOT:                  Appears normal         Lower Extremities:      Appears normal
 LVOT:                  Appears normal

 Other:  Parents do not wish to know sex of fetus at this time, appears to be a
         male. Nasal bone, lenses, maxilla, mandible, Heels/feet and open
         hands/5th digits visualized. Technically difficult due to fetal position.
Cervix Uterus Adnexa

 Cervix
 Length:           3.37  cm.
 Normal appearance by transabdominal scan.
 Uterus
 No abnormality visualized.

 Right Ovary
 Within normal limits.

 Left Ovary
 Within normal limits.

 Cul De Sac
 No free fluid seen.

 Adnexa
 No abnormality visualized.
Comments

 This patient was seen for a detailed fetal anatomy scan as
 she has screened positive as a silent carrier for alpha
 thalassemia.  The father of the baby was born with
 polydactyly.
 She denies any significant past medical history and denies
 any problems in her current pregnancy.
 She had a cell free DNA test earlier in her pregnancy which
 indicated a low risk for trisomy 21, 18, and 13.  The patient
 did not want the fetal gender revealed today.
 She was informed that the fetal growth and amniotic fluid
 level were appropriate for her gestational age.
 There were no obvious fetal anomalies noted on today's
 ultrasound exam.
 The patient was informed that anomalies may be missed due
 to technical limitations. If the fetus is in a suboptimal position
 or maternal habitus is increased, visualization of the fetus in
 the maternal uterus may be impaired.
 As she is a silent carrier for alpha thalassemia, the patient is
 meeting with our genetic counselor following today's
 ultrasound exam.  She understands that she may have her
 partner screened to determine if he is also a carrier for alpha
 thalassemia.
 They understand that definitive diagnosis of alpha
 thalassemia can be made via an amniocentesis.  They
 declined the procedure today.
 Follow up as indicated.

## 2023-02-24 ENCOUNTER — Ambulatory Visit (INDEPENDENT_AMBULATORY_CARE_PROVIDER_SITE_OTHER): Payer: Medicaid Other

## 2023-02-24 ENCOUNTER — Other Ambulatory Visit (HOSPITAL_COMMUNITY)
Admission: RE | Admit: 2023-02-24 | Discharge: 2023-02-24 | Disposition: A | Discharge status: Home / Self Care | Payer: Medicaid Other | Source: Ambulatory Visit

## 2023-02-24 VITALS — BP 137/93 | HR 92 | Wt 171.6 lb

## 2023-02-24 DIAGNOSIS — Z113 Encounter for screening for infections with a predominantly sexual mode of transmission: Secondary | ICD-10-CM

## 2023-02-24 DIAGNOSIS — Z01419 Encounter for gynecological examination (general) (routine) without abnormal findings: Secondary | ICD-10-CM | POA: Insufficient documentation

## 2023-02-24 DIAGNOSIS — N898 Other specified noninflammatory disorders of vagina: Secondary | ICD-10-CM | POA: Insufficient documentation

## 2023-02-24 MED ORDER — METRONIDAZOLE 0.75 % VA GEL: 1.0000 | Freq: Every day | VAGINAL | 1 refills | Status: AC

## 2023-02-24 NOTE — Progress Notes (Signed)
 Pt presents for annual visit. No BC, declines BC. Pap done in 2023, WNL. Pt has questions but wants to wait for CNM. Wants all STD testing done. Pt declines breast/pelvic exam today. Just would like to discuss concerns.

## 2023-02-24 NOTE — Progress Notes (Signed)
 GYNECOLOGY OFFICE VISIT NOTE-WELL WOMAN EXAM  History:   Cindy Kelley is a 23 year old here today for annual exam. She reports vaginal odor that has been present for ~ 3 weeks. She states it smells vinegary.  She notes that the smell is stronger after sex. She endorses vaginal discharge that is sometimes slightly yellow or pale gray color.   Patient reports LMP 02/12/2023 and was normal lasting 3-4 days with heavy to light flow.  She endorses cramping that is moderate and relieved with heating pad.  Patient  denies any abnormal vaginal discharge, bleeding, pelvic pain or other concerns.   Birth Control:  No birth control. Not desiring. No pregnancy desired. Condom usage occasionally.  Reproductive Concerns Sexually Active: Yes Partners Type: Female Number of partners in last year: One STD Testing: Full STD testing  Obstetrical History: G3P0030 C/S 2023 Gynecological History: No history of abnormal paps. No gynecological surgeries.  Vaginal/GU Concerns: No issues with urination, constipation, or diarrhea.  Breast Concerns/Exams: She endorses SBA and occasionally checks her breast. She is unsure of family history of breast and thinks her great grandmother had it.  She is also unsure of family history of uterine, cervical, or ovarian cancer  Medical and Nutrition PCP: [facility] [facility]; December  Significant PMx:  Osteosarcoma with knee replacement.  Exercise: None Tobacco/Drugs/Alcohol/Vaping: Occasional alcohol (once week)-Canned mix drinks Nutrition: Endorses balanced intake Medications: MV and Vitamin D  Social Safety at home: Endorses safety at home. Lives with partner and toddler DV/A: Denies Social Support: Endorses Employment: None School: Nursing; [employer] in [place]  Past Medical History:  Diagnosis Date   Hx of osteosarcoma    Age 66   Hypertension     Past Surgical History:  Procedure Laterality Date   REPLACEMENT TOTAL KNEE Right    2015, 2017, 2019     The following portions of the patient's history were reviewed and updated as appropriate: allergies, current medications, past family history, past medical history, past social history, past surgical history and problem list.   Health Maintenance: Pap: Feb 04, 2021, Negative Results.  Mammogram: N/A.  Colonoscopy: N/A Review of Systems:  Pertinent items noted in HPI and remainder of comprehensive ROS otherwise negative.    Objective:    Physical Exam BP (!) 137/93   Pulse 92   Wt 171 lb 9.6 oz (77.8 kg)   LMP 02/12/2023 (Exact Date)   Breastfeeding No   BMI 27.70 kg/m  Physical Exam Vitals reviewed.  Constitutional:      General: She is not in acute distress.    Appearance: Normal appearance.  HENT:     Head: Normocephalic and atraumatic.  Eyes:     Conjunctiva/sclera: Conjunctivae normal.  Cardiovascular:     Rate and Rhythm: Normal rate and regular rhythm.     Heart sounds: Normal heart sounds.  Pulmonary:     Effort: Pulmonary effort is normal. No respiratory distress.     Breath sounds: Normal breath sounds.  Abdominal:     General: Bowel sounds are normal.     Palpations: Abdomen is soft.     Tenderness: There is no abdominal tenderness.  Musculoskeletal:        General: Normal range of motion.     Cervical back: Normal range of motion.  Skin:    General: Skin is warm and dry.  Neurological:     Mental Status: She is alert and oriented to person, place, and time.  Psychiatric:  Mood and Affect: Mood normal.        Behavior: Behavior normal.      Labs and Imaging No results found for this or any previous visit (from the past week). No results found.   Assessment & Plan:  23 year old Female Well Woman Exam STD Screen Vaginal Odor   1. Encounter for well woman exam -Exam performed and findings discussed. -Educated on ASCCP guidelines regarding pap smear evaluation and frequency. -Informed  that next pap not due until Jan 2026. -Encouraged  to utilize Mychart for reviewing of results, communication with office, and scheduling of appts. -Educated on AHA exercise recommendations of 30 minutes of moderate to vigorous activity at least 5x/week. -Educated and encouraged to continue SBE with increased breast awareness including examination of breast for skin changes, moles, tenderness, etc.  -Plan for CBE at next annual exam in 2026.  2. Screening examination for STD (sexually transmitted disease) -CV collected. -Plan to treat accordingly.   3. Vaginal odor -Will send script for metrogel  as symptoms suggestive of BV.   Routine preventative health maintenance measures emphasized. Please refer to After Visit Summary for other counseling recommendations.   No follow-ups on file.      Harlene LITTIE Duncans, CNM 02/24/2023

## 2023-02-25 LAB — CERVICOVAGINAL ANCILLARY ONLY
Bacterial Vaginitis (gardnerella): POSITIVE — AB
Candida Glabrata: NEGATIVE
Candida Vaginitis: NEGATIVE
Chlamydia: NEGATIVE
Comment: NEGATIVE
Comment: NEGATIVE
Comment: NEGATIVE
Comment: NEGATIVE
Comment: NEGATIVE
Comment: NORMAL
Neisseria Gonorrhea: NEGATIVE
Trichomonas: NEGATIVE

## 2023-03-01 LAB — RPR: RPR Ser Ql: NONREACTIVE

## 2023-03-01 LAB — HIV ANTIBODY (ROUTINE TESTING W REFLEX): HIV Screen 4th Generation wRfx: NONREACTIVE

## 2023-03-01 LAB — HEPATITIS C ANTIBODY: Hep C Virus Ab: NONREACTIVE

## 2023-03-01 LAB — HEPATITIS B SURFACE ANTIGEN: Hepatitis B Surface Ag: NEGATIVE

## 2023-05-25 IMAGING — US US MFM OB FOLLOW-UP
1 series · 12 of 28 positions shown · non-contrast
Comparison: none

[Series 1: us mfm ob follow-up · 61 acquisitions, 12 frames shown]
[im 3/61]
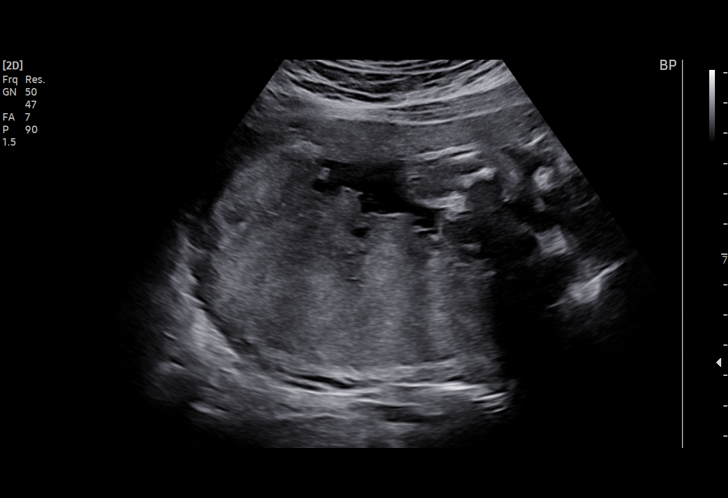
[im 7/61]
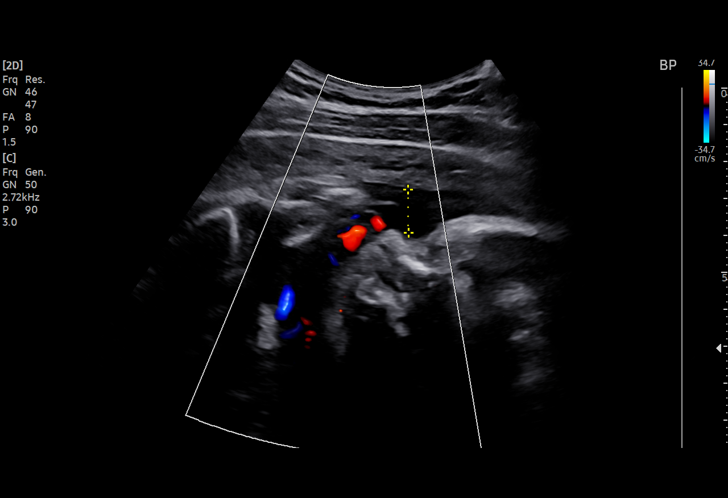
[im 12/61]
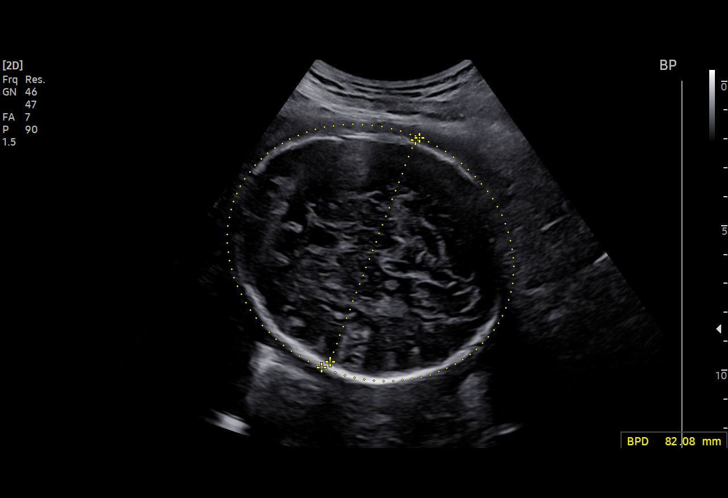
[im 18/61]
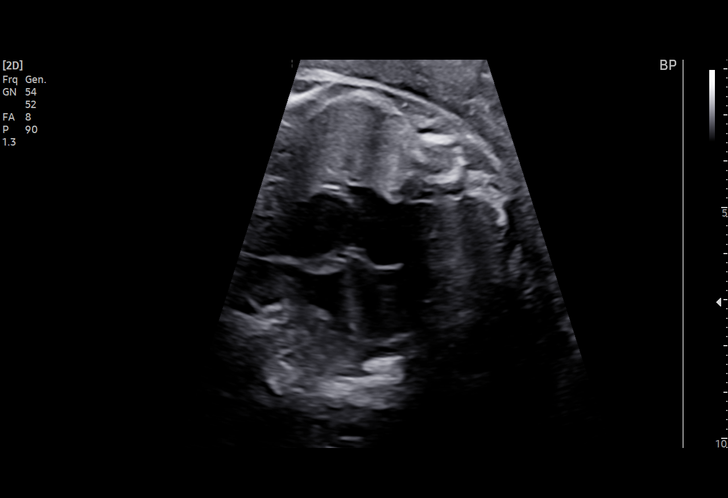
[im 23/61]
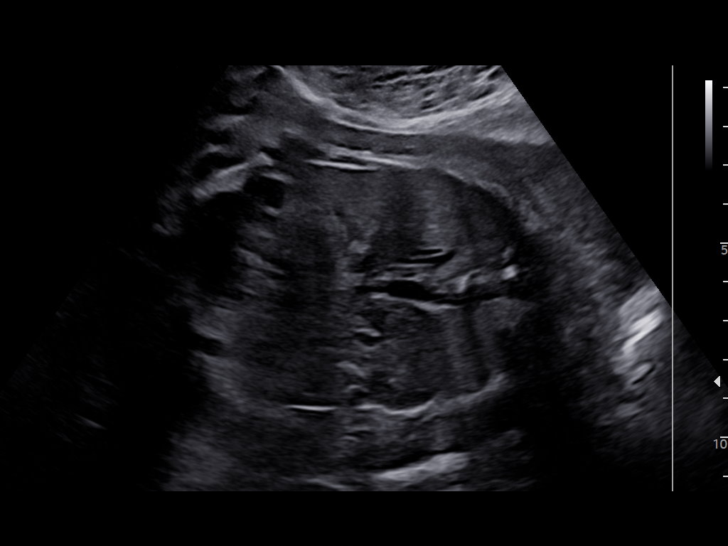
[im 27/61]
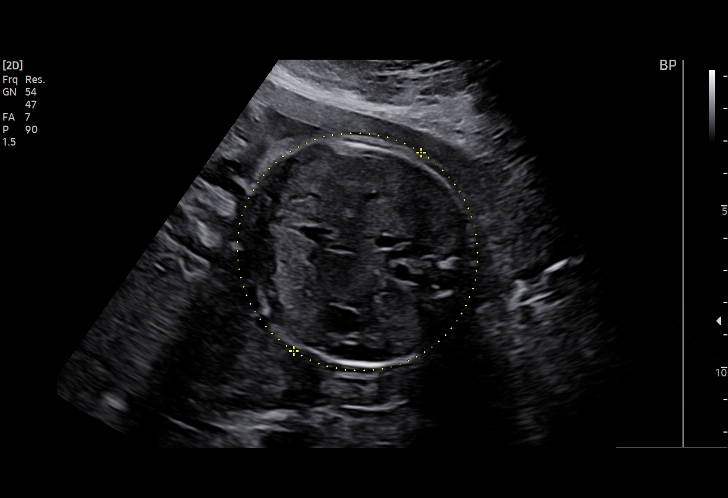
[im 34/61]
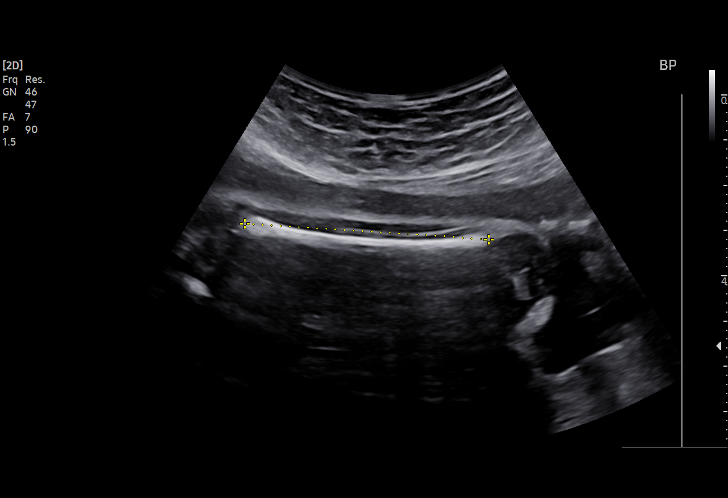
[im 38/61]
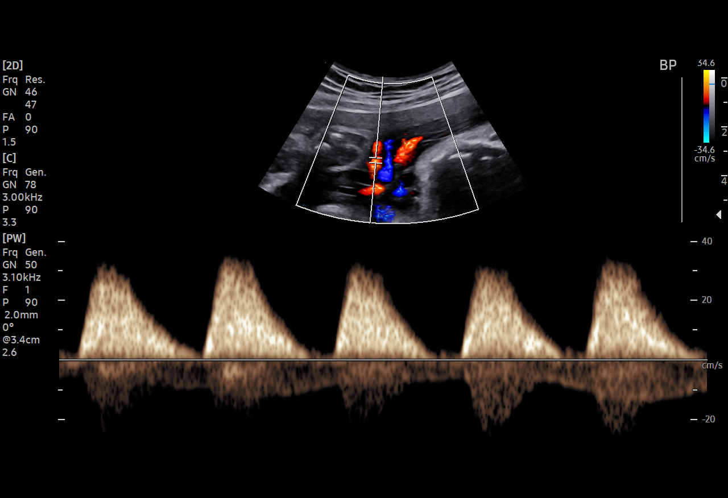
[im 43/61]
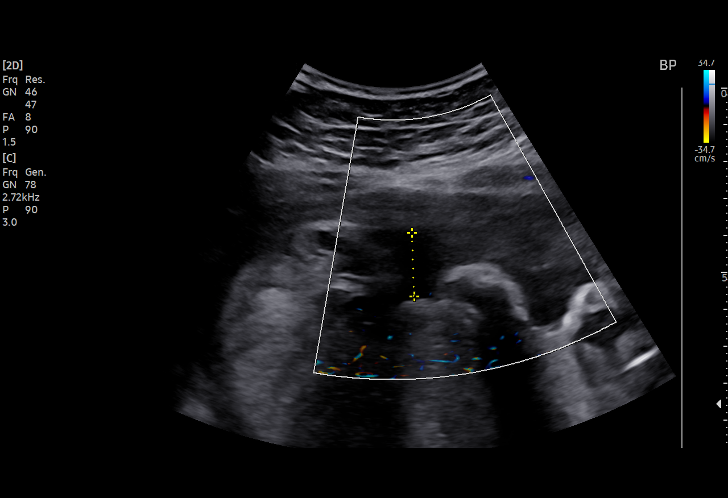
[im 49/61]
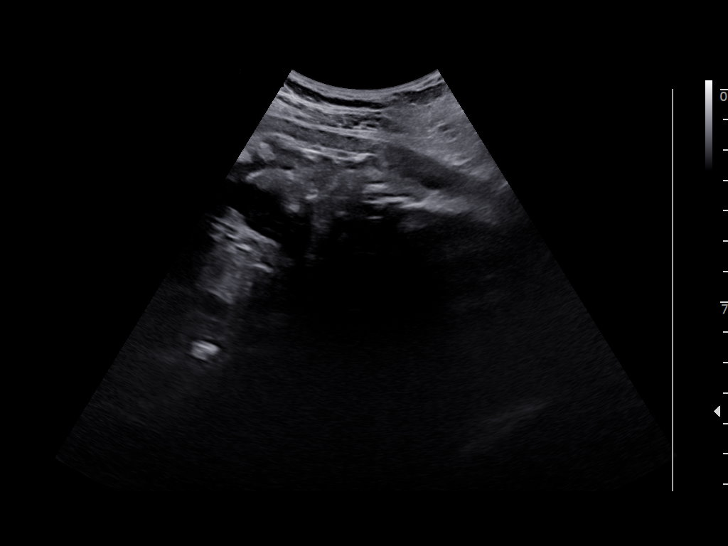
[im 54/61]
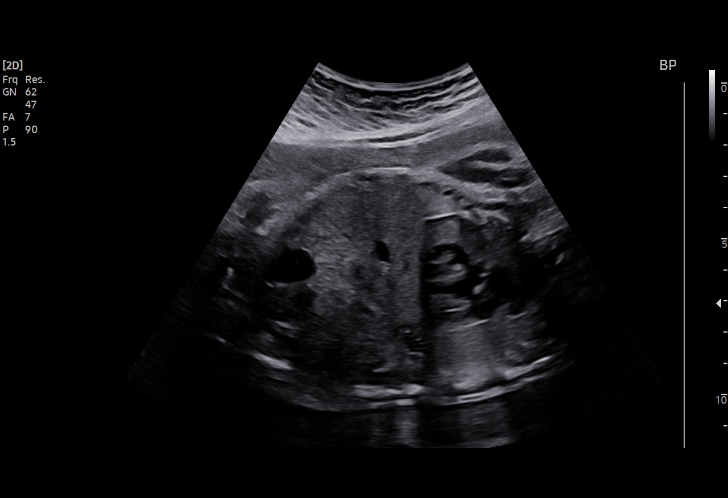
[im 58/61]
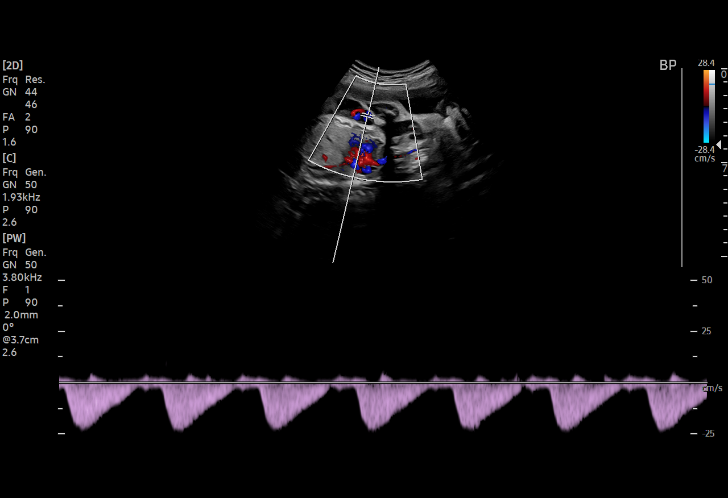

[12 of 28 positions shown; findings below may reference images not displayed]

[REDACTED]

 3  US MFM UA CORD DOPPLER                76820.02    PANGALILA

Indications

 Hypertension - Chronic with superimposed
 preeclampsia
 Maternal care for known or suspected poor
 fetal growth, third trimester, not applicable or
 unspecified IUGR
 Fetal abnormality - other known or suspected
 Genetic carrier (KAROLINE SANGSTER [employer] )
 LR NIPS
 Family history of congenital anomaly (FOB
 polydactyly)
 33 weeks gestation of pregnancy
Fetal Evaluation

 Num Of Fetuses:         1
 Fetal Heart Rate(bpm):  134
 Cardiac Activity:       Observed
 Presentation:           Cephalic
 Placenta:               Posterior
 P. Cord Insertion:      Previously Visualized
 Amniotic Fluid
 AFI FV:      Oligohydramnios

 AFI Sum(cm)     %Tile       Largest Pocket(cm)
 4.59            < 3

 RUQ(cm)       RLQ(cm)       LUQ(cm)        LLQ(cm)
 1.84          1.58          0
Biophysical Evaluation

 Amniotic F.V:   Pocket < 2 cm two          F. Tone:        Observed
                 planes
 F. Movement:    Observed                   Score:          [DATE]
 F. Breathing:   Observed
Biometry

 BPD:     81.49  mm     G. Age:  32w 5d         17  %    CI:        77.13   %    70 - 86
                                                         FL/HC:      18.4   %    19.4 -
 HC:    293.81   mm     G. Age:  32w 3d        1.9  %    HC/AC:      1.28        0.96 -
 AC:    230.12   mm     G. Age:  27w 3d        < 1  %    FL/BPD:     66.5   %    71 - 87
 FL:      54.17  mm     G. Age:  28w 5d        < 1  %    FL/AC:      23.5   %    20 - 24
 HUM:      47.7  mm     G. Age:  28w 0d        < 5  %

 Est. FW:    8781  gm    2 lb 12 oz     < 1  %
OB History

 Blood Type:   O+
 Gravidity:    3         Term:   0        Prem:   0        SAB:   1
 TOP:          1       Ectopic:  0        Living: 0
Gestational Age

 LMP:           33w 6d        Date:  10/28/20                 EDD:   08/04/21
 U/S Today:     30w 2d                                        EDD:   08/29/21
 Best:          33w 6d     Det. By:  LMP  (10/28/20)          EDD:   08/04/21
Anatomy

 Cranium:               Appears normal         Aortic Arch:            Previously seen
 Cavum:                 Appears normal         Ductal Arch:            Previously seen
 Ventricles:            Previously seen        Diaphragm:              Appears normal
 Choroid Plexus:        Previously seen        Stomach:                Appears normal, left
                                                                       sided
 Cerebellum:            Previously seen        Abdomen:                Previously seen
 Posterior Fossa:       Previously seen        Abdominal Wall:         Previously seen
 Nuchal Fold:           Not applicable (>20    Cord Vessels:           Appears normal (3
                        wks GA)                                        vessel cord)
 Face:                  Orbits and profile     Kidneys:                Appear normal
                        previously seen
 Lips:                  Previously seen        Bladder:                Appears normal
 Thoracic:              Appears normal         Spine:                  Previously seen
 Heart:                 Appears normal         Upper Extremities:      Previously seen
                        (4CH, axis, and
                        situs)
 RVOT:                  Previously seen        Lower Extremities:      Previously seen
 LVOT:                  Previously seen
 Other:  Nasal bone, lenses, maxilla, mandible, Heels/feet and open
         hands/5th digits previously visualized. Technically difficult due to fetal
         position.
Doppler - Fetal Vessels

 Umbilical Artery
                                                             ADFV    RDFV
                                                               Yes      No

Cervix Uterus Adnexa

 Cervix
 Not visualized (advanced GA >62wks)

 Uterus
 Normal shape and size.

 Right Ovary
 Not visualized.

 Left Ovary
 Not visualized.
Comments

 Norrdin Sylla was seen for a follow up exam due to
 chronic hypertension.  The patient reports that her blood
 pressures have been elevated for the past 6 weeks or so.
 She is currently treated with nifedipine.  Her blood pressures
 in our office today were 152/108 and 148/103.  She reports
 that she experienced headaches and saw some floaters
 yesterday.
 On today's exam, the overall EFW of 2 pounds 12 ounces
 measures at less than the 1st percentile for her gestational
 age, indicating severe IUGR.  Oligohydramnios with a total
 AFI of 4.6 cm is noted.
 A biophysical profile performed today due to fetal growth
 restriction was [DATE].  She received a -2 for an absent 2 x
 2 pocket of amniotic fluid.
 Doppler studies of the umbilical arteries showed absent end-
 diastolic flow.  There were no signs of reversed flow noted
 today.
 Due to severe IUGR with absent end-diastolic flow and
 severe range blood pressures, the patient was sent to the
 hospital for admission following today's ultrasound exam.

 She should receive a complete course of antenatal
 corticosteroids and medications to control her blood
 pressures.  PIH labs and a P/C ratio should also be obtained.

 Due to severe IUGR with absent end-diastolic flow, at her
 current gestational age, induction of labor should be started
 12 hours after she receives the second dose of steroids.

 The patient understands that her baby will require a NICU
 admission following delivery.

 She should receive magnesium sulfate for maternal seizure
 prophylaxis for 24 hours after delivery

 The patient and her partner stated that they understood
 everything that was discussed with them today and all of their
 questions have been answered.

 A total of 20 minutes was spent counseling and coordinating
 the care for this patient.  Greater than 50% of the time was
 spent in direct face-to-face contact.

## 2023-08-16 ENCOUNTER — Telehealth

## 2024-01-02 ENCOUNTER — Ambulatory Visit

## 2024-01-02 ENCOUNTER — Other Ambulatory Visit (HOSPITAL_COMMUNITY)
Admission: RE | Admit: 2024-01-02 | Discharge: 2024-01-02 | Disposition: A | Source: Ambulatory Visit | Attending: Obstetrics | Admitting: Obstetrics

## 2024-01-02 VITALS — BP 127/84 | HR 134 | Wt 172.1 lb

## 2024-01-02 DIAGNOSIS — N898 Other specified noninflammatory disorders of vagina: Secondary | ICD-10-CM | POA: Diagnosis present

## 2024-01-02 NOTE — Progress Notes (Signed)
..  SUBJECTIVE:  23 y.o. female complains of yellowish vaginal discharge and odor for 1 week(s). Denies abnormal vaginal bleeding or significant pelvic pain or fever. No UTI symptoms. Denies history of known exposure to STD. Pulse elevated at 134 today, pt states that she is in pain because she injured her knee yesterday.  No LMP recorded.  OBJECTIVE:  She appears well, afebrile. Urine dipstick: not done.  ASSESSMENT:  Vaginal Discharge  Vaginal Odor   PLAN:  GC, chlamydia, trichomonas, BVAG, CVAG probe sent to lab. Treatment: To be determined once lab results are received ROV prn if symptoms persist or worsen.

## 2024-01-03 LAB — CERVICOVAGINAL ANCILLARY ONLY
Bacterial Vaginitis (gardnerella): NEGATIVE
Candida Glabrata: NEGATIVE
Candida Vaginitis: NEGATIVE
Chlamydia: NEGATIVE
Comment: NEGATIVE
Comment: NEGATIVE
Comment: NEGATIVE
Comment: NEGATIVE
Comment: NEGATIVE
Comment: NORMAL
Neisseria Gonorrhea: NEGATIVE
Trichomonas: NEGATIVE

## 2024-01-06 ENCOUNTER — Ambulatory Visit: Payer: Self-pay | Admitting: Obstetrics and Gynecology

## 2024-02-23 NOTE — Progress Notes (Unsigned)
 "  ANNUAL EXAM Patient name: Cindy Kelley MRN [id number]  Date of birth: 03-Dec-2000 Chief Complaint:   No chief complaint on file.  History of Present Illness:   Cindy Kelley is a 24 y.o. G3P0030 being seen today for a routine annual exam.  Current complaints: ***  Menstrual concerns? {yes/no:20286}  *** Breast or nipple changes? {yes/no:20286} *** Contraception use? {yes/no:20286} *** Sexually active? {yes/no:20286} ***  No LMP recorded.   The pregnancy intention screening data noted above was reviewed. Potential methods of contraception were discussed. The patient elected to proceed with No data recorded.   Last pap     Component Value Date/Time   DIAGPAP  02/04/2021 1408    - Negative for Intraepithelial Lesions or Malignancy (NILM)   DIAGPAP - Benign reactive/reparative changes 02/04/2021 1408   ADEQPAP  02/04/2021 1408    Satisfactory for evaluation; transformation zone component PRESENT.     H/O abnormal pap: {yes/yes***/no:23866} Last mammogram: ***.  Last colonoscopy: ***.      05/13/2021    9:42 AM 01/30/2021   10:47 AM  Depression screen PHQ 2/9  Decreased Interest 1 1  Down, Depressed, Hopeless 1 1  PHQ - 2 Score 2 2  Altered sleeping 1 1  Tired, decreased energy 1 1  Change in appetite 0 0  Feeling bad or failure about yourself  0 0  Trouble concentrating 0 0  Moving slowly or fidgety/restless 0 0  Suicidal thoughts 0 0  PHQ-9 Score 4  4      Data saved with a previous flowsheet row definition        05/13/2021    9:43 AM 01/30/2021   10:49 AM  GAD 7 : Generalized Anxiety Score  Nervous, Anxious, on Edge 2  1   Control/stop worrying 1  0   Worry too much - different things 1  0   Trouble relaxing 0  0   Restless 0  0   Easily annoyed or irritable 1  1   Afraid - awful might happen 1  0   Total GAD 7 Score 6 2     Data saved with a previous flowsheet row definition     Review of Systems:   Pertinent items are noted in  HPI Denies any headaches, blurred vision, fatigue, shortness of breath, chest pain, abdominal pain, abnormal vaginal discharge/itching/odor/irritation, problems with periods, bowel movements, urination, or intercourse unless otherwise stated above. Pertinent History Reviewed:  Reviewed past medical,surgical, social and family history.  Reviewed problem list, medications and allergies. Physical Assessment:  There were no vitals filed for this visit.There is no height or weight on file to calculate BMI.        Physical Examination:   General appearance - well appearing, and in no distress  Mental status - alert, oriented to person, place, and time  Psych:  She has a normal mood and affect  Skin - warm and dry, normal color, no suspicious lesions noted  Chest - effort normal, all lung fields clear to auscultation bilaterally  Heart - normal rate and regular rhythm  Breasts - breasts appear normal, no suspicious masses, no skin or nipple changes or  axillary nodes  Abdomen - soft, nontender, nondistended, no masses or organomegaly  Pelvic -  VULVA: normal appearing vulva with no masses, tenderness or lesions   VAGINA: normal appearing vagina with normal color and discharge, no lesions   CERVIX: normal appearing cervix without discharge or lesions, no CMT  Thin  prep pap is {Desc; done/not:10129} *** HR HPV cotesting  UTERUS: uterus is felt to be normal size, shape, consistency and nontender   ADNEXA: No adnexal masses or tenderness noted.  Extremities:  No swelling or varicosities noted  Chaperone present for exam  No results found for this or any previous visit (from the past 24 hours).    Assessment & Plan:  There are no diagnoses linked to this encounter.      No orders of the defined types were placed in this encounter.   Meds: No orders of the defined types were placed in this encounter.   Follow-up: No follow-ups on file.  Carter Quarry, MD 02/23/2024 3:00 PM "

## 2024-02-24 ENCOUNTER — Ambulatory Visit: Admitting: Obstetrics and Gynecology

## 2024-03-23 ENCOUNTER — Ambulatory Visit: Admitting: Obstetrics & Gynecology
